# Patient Record
Sex: Male | Born: 1990 | Race: White | Hispanic: No | Marital: Married | Smoking: Current every day smoker
Health system: Southern US, Community
[De-identification: ages and names within clinical notes are randomized; demographics above are authoritative.]

## PROBLEM LIST (undated history)

## (undated) HISTORY — PX: TOE SURGERY: SHX1073

## (undated) HISTORY — PX: WISDOM TOOTH EXTRACTION: SHX21

---

## 2016-06-21 ENCOUNTER — Emergency Department
Admission: EM | Admit: 2016-06-21 | Discharge: 2016-06-21 | Disposition: A | Discharge status: Home / Self Care | Payer: Self-pay | Attending: Emergency Medicine | Admitting: Emergency Medicine

## 2016-06-21 ENCOUNTER — Encounter: Payer: Self-pay | Admitting: Emergency Medicine

## 2016-06-21 ENCOUNTER — Emergency Department: Payer: Self-pay

## 2016-06-21 DIAGNOSIS — F17210 Nicotine dependence, cigarettes, uncomplicated: Secondary | ICD-10-CM | POA: Insufficient documentation

## 2016-06-21 DIAGNOSIS — R52 Pain, unspecified: Secondary | ICD-10-CM | POA: Insufficient documentation

## 2016-06-21 DIAGNOSIS — J029 Acute pharyngitis, unspecified: Secondary | ICD-10-CM | POA: Insufficient documentation

## 2016-06-21 DIAGNOSIS — R509 Fever, unspecified: Secondary | ICD-10-CM

## 2016-06-21 DIAGNOSIS — R05 Cough: Secondary | ICD-10-CM | POA: Insufficient documentation

## 2016-06-21 LAB — COMPREHENSIVE METABOLIC PANEL
ALK PHOS: 69 U/L (ref 38–126)
ALT: 61 U/L (ref 17–63)
AST: 37 U/L (ref 15–41)
Albumin: 4.3 g/dL (ref 3.5–5.0)
Anion gap: 11 (ref 5–15)
BILIRUBIN TOTAL: 1.1 mg/dL (ref 0.3–1.2)
BUN: 10 mg/dL (ref 6–20)
CO2: 23 mmol/L (ref 22–32)
Calcium: 9.6 mg/dL (ref 8.9–10.3)
Chloride: 101 mmol/L (ref 101–111)
Creatinine, Ser: 0.97 mg/dL (ref 0.61–1.24)
GFR calc Af Amer: >60 mL/min (ref >60)
GFR calc non Af Amer: >60 mL/min (ref >60)
GLUCOSE: 120 mg/dL — AB (ref 65–99)
POTASSIUM: 4.4 mmol/L (ref 3.5–5.1)
SODIUM: 135 mmol/L (ref 135–145)
TOTAL PROTEIN: 8.3 g/dL — AB (ref 6.5–8.1)

## 2016-06-21 LAB — URINALYSIS, COMPLETE (UACMP) WITH MICROSCOPIC
BACTERIA UA: NONE SEEN
Bilirubin Urine: NEGATIVE
Glucose, UA: NEGATIVE mg/dL
HGB URINE DIPSTICK: NEGATIVE
Ketones, ur: NEGATIVE mg/dL
Leukocytes, UA: NEGATIVE
NITRITE: NEGATIVE
PROTEIN: NEGATIVE mg/dL
SPECIFIC GRAVITY, URINE: 1.009 (ref 1.005–1.030)
pH: 6 (ref 5.0–8.0)

## 2016-06-21 LAB — INFLUENZA PANEL BY PCR (TYPE A & B)
INFLAPCR: NEGATIVE
INFLBPCR: NEGATIVE

## 2016-06-21 LAB — CBC
HEMATOCRIT: 45.2 % (ref 40.0–52.0)
HEMOGLOBIN: 15.3 g/dL (ref 13.0–18.0)
MCH: 28.7 pg (ref 26.0–34.0)
MCHC: 33.9 g/dL (ref 32.0–36.0)
MCV: 84.6 fL (ref 80.0–100.0)
Platelets: 238 10*3/uL (ref 150–440)
RBC: 5.35 MIL/uL (ref 4.40–5.90)
RDW: 12.2 % (ref 11.5–14.5)
WBC: 27.1 10*3/uL — ABNORMAL HIGH (ref 3.8–10.6)

## 2016-06-21 LAB — LIPASE, BLOOD: Lipase: 22 U/L (ref 11–51)

## 2016-06-21 MED ORDER — SODIUM CHLORIDE 0.9 % IV BOLUS (SEPSIS)
1000.0000 mL | Freq: Once | INTRAVENOUS | Status: AC
  Administered 2016-06-21: 1000 mL via INTRAVENOUS

## 2016-06-21 MED ORDER — ONDANSETRON HCL 4 MG PO TABS: 4.0000 mg | ORAL_TABLET | Freq: Three times a day (TID) | ORAL | 0 refills | Status: DC | PRN

## 2016-06-21 MED ORDER — DEXAMETHASONE SODIUM PHOSPHATE 10 MG/ML IJ SOLN
10.0000 mg | Freq: Once | INTRAMUSCULAR | Status: AC
  Administered 2016-06-21: 10 mg via INTRAMUSCULAR
  Filled 2016-06-21: qty 1

## 2016-06-21 NOTE — ED Triage Notes (Signed)
Patient presents to ED via POV ambulatory to triage with flu like symptoms x 3 days. Patient also c/o sore throat and vomiting.

## 2016-06-21 NOTE — ED Provider Notes (Signed)
[facility] Emergency Department Provider Note   ____________________________________________   I have reviewed the triage vital signs and the nursing notes.   HISTORY  Chief Complaint Fever   History limited by: Not Limited   HPI Colin Moore is a 26 y.o. male who presents to the emergency department today because of concern for flulike symptoms. The patient states that for the past couple of days he has had some sore throat and cough. However this morning around 5 AM he started developing severe body aches. This continued throughout the day. In addition he developed nausea and vomiting this afternoon. He states that many people in his family have been sick recently although does not sound like any of been diagnosed with the flu. The patient has had chills. He is tried over-the-counter pain medication and cold medication.   History reviewed. No pertinent past medical history.  There are no active problems to display for this patient.   History reviewed. No pertinent surgical history.  Prior to Admission medications   Not on File    Allergies Penicillins  No family history on file.  Social History Social History  Substance Use Topics  . Smoking status: Current Every Day Smoker    Packs/day: 0.50    Types: Cigarettes  . Smokeless tobacco: Never Used  . Alcohol use Yes     Comment: Socially    Review of Systems  Constitutional: Positive for chills. Cardiovascular: Negative for chest pain. Respiratory: Positive for cough. Gastrointestinal: Negative for abdominal pain, vomiting and diarrhea. Genitourinary: Negative for dysuria. Musculoskeletal: Positive for body aches. Neurological: Negative for headaches, focal weakness or numbness.  10-point ROS otherwise negative.  ____________________________________________   PHYSICAL EXAM:  VITAL SIGNS: ED Triage Vitals [06/21/16 1856]  Enc Vitals Group     BP 139/89     Pulse Rate (!)  135     Resp 17     Temp 99.6 F (37.6 C)     Temp Source Oral     SpO2 96 %     Weight 210 lb (95.3 kg)     Height 5\' 8"  (1.727 m)     Head Circumference      Peak Flow      Pain Score 6   Constitutional: Alert and oriented. Well appearing and in no distress. Eyes: Conjunctivae are normal. Normal extraocular movements. ENT   Head: Normocephalic and atraumatic.   Nose: No congestion/rhinnorhea.   Mouth/Throat: Mucous membranes are moist. Tonsils enlarged bilaterally with erythema. Uvula midline.    Neck: No stridor. Hematological/Lymphatic/Immunilogical: No cervical lymphadenopathy. Cardiovascular: Tachycardic, regular rhythm.  No murmurs, rubs, or gallops.  Respiratory: Normal respiratory effort without tachypnea nor retractions. Breath sounds are clear and equal bilaterally. No wheezes/rales/rhonchi. Gastrointestinal: Soft and non tender. No rebound. No guarding.  Genitourinary: Deferred Musculoskeletal: Normal range of motion in all extremities. No lower extremity edema. Neurologic:  Normal speech and language. No gross focal neurologic deficits are appreciated.  Skin:  Skin is warm, dry and intact. No rash noted. Psychiatric: Mood and affect are normal. Speech and behavior are normal. Patient exhibits appropriate insight and judgment.  ____________________________________________    LABS (pertinent positives/negatives)  Labs Reviewed  COMPREHENSIVE METABOLIC PANEL - Abnormal; Notable for the following:       Result Value   Glucose, Bld 120 (*)    Total Protein 8.3 (*)    All other components within normal limits  CBC - Abnormal; Notable for the following:    WBC 27.1 (*)  All other components within normal limits  URINALYSIS, COMPLETE (UACMP) WITH MICROSCOPIC - Abnormal; Notable for the following:    Color, Urine YELLOW (*)    APPearance CLEAR (*)    Squamous Epithelial / LPF 0-5 (*)    All other components within normal limits  LIPASE, BLOOD   INFLUENZA PANEL BY PCR (TYPE A & B)     ____________________________________________   EKG  None  ____________________________________________    RADIOLOGY  CXR IMPRESSION:  No active cardiopulmonary disease.   ___________________________________________   PROCEDURES  Procedures  ____________________________________________   INITIAL IMPRESSION / ASSESSMENT AND PLAN / ED COURSE  Pertinent labs & imaging results that were available during my care of the patient were reviewed by me and considered in my medical decision making (see chart for details).  Patient presents to the emergency department today with flulike symptoms. The patient flu negative. Chest x-ray without any pneumonia. Patient did feel better after IV fluids. Patient's tonsils were enlarged however uvula was midline. This point I doubt any deep tissue infection. Will give patient a shot of steroids to help with the pharyngitis. Additionally will give patient prescription for Zofran.  ____________________________________________   FINAL CLINICAL IMPRESSION(S) / ED DIAGNOSES  Final diagnoses:  Fever, unspecified fever cause     Note: This dictation was prepared with Dragon dictation. Any transcriptional errors that result from this process are unintentional     Phineas Semen, MD 06/21/16 2336

## 2016-06-21 NOTE — ED Notes (Signed)

## 2016-06-21 NOTE — Discharge Instructions (Signed)
Please seek medical attention for any high fevers, chest pain, shortness of breath, change in behavior, persistent vomiting, bloody stool or any other new or concerning symptoms.  

## 2016-06-21 NOTE — ED Notes (Signed)
Patient denies pain and is resting comfortably.  

## 2016-06-21 NOTE — ED Notes (Signed)
REturn from Xray.  AAOx3.  Skin warm and dry.

## 2017-05-12 NOTE — Progress Notes (Signed)
05/14/2017 3:52 PM   Colin Gandyharles Brunner 10/11/1990 [id number]  Referring provider: No referring provider defined for this encounter.  Chief Complaint  Patient presents with  . Advice Only    Vasectomy    HPI: Mr. Colin GandyCharles Vanhouten is a [age] Caucasian male who presents today requesting a vasectomy.  Patient has 3 children, two sons and one daughter, who desires no further biological children.  Patient denies any history of chronic prostatitis, epididymitis, orchitis, or other genital pain.  Today, we discussed what the vas deferens is, where it is located, and its function. We reviewed the procedure for vasectomy, it's risks, benefits, alternatives, and likelihood of achieving his goals.   We discussed in detail the procedure, complications, and recovery as well as the need for clearance prior to unprotected intercourse. We discussed that vasectomy does not protect against sexually transmitted diseases. We discussed that this procedure does not result in immediate sterility and that they would need to use other forms of birth control until he has been cleared with a three month negative postvasectomy semen analyses.  I explained that the procedure is considered to be permanent and that attempts at reversal have varying degrees of success. These options include vasectomy reversal, sperm retrieval, and in vitro fertilization; these can be very expensive.   We discussed the chance of postvasectomy pain syndrome which occurs in less than 5% of patients. I explained to the patient that there is no treatment to resolve this chronic pain, and that if it developed I would not be able to help resolve the issue, but that surgery is generally not needed for correction.   I explained there have even been reports of systemic like illness associated with this chronic pain, and that there was no good cure. I explained that vasectomy it is not a 100% reliable form of birth control, and the risk of  pregnancy after vasectomy is approximately 1 in 2000 men who had a negative postvasectomy semen analysis or rare non-motile sperm.  I explained that repeat vasectomy was necessary in less than 1% of vasectomy procedures when employing the type of technique that is performed in the office. I explained that he should refrain from ejaculation for approximately one week following vasectomy. I explained that there are other options for birth control which are permanent and non-permanent; we discussed these.  I explained the rates of surgical complications, such as symptomatic hematoma or infection, are low (1-2%) and vary with the surgeon's experience and criteria used to diagnose the complication.   PMH: No past medical history on file.  Surgical History: Past Surgical History:  Procedure Laterality Date  . TOE SURGERY  10 years ago  . WISDOM TOOTH EXTRACTION      Home Medications:  Allergies as of 05/14/2017      Reactions   Penicillins Hives      Medication List        Accurate as of 05/14/17  3:52 PM. Always use your most recent med list.          diazepam 10 MG tablet Commonly known as:  VALIUM Take one 30 minutes prior to the vasectomy       Allergies:  Allergies  Allergen Reactions  . Penicillins Hives    Family History: No family history on file.  Social History:  reports that he has been smoking cigarettes.  He has been smoking about 0.50 packs per day. he has never used smokeless tobacco. He reports that he drinks alcohol. He  reports that he does not use drugs.  ROS: UROLOGY Frequent Urination?: No Hard to postpone urination?: Yes Burning/pain with urination?: No Get up at night to urinate?: Yes Leakage of urine?: No Urine stream starts and stops?: No Trouble starting stream?: No Do you have to strain to urinate?: No Blood in urine?: No Urinary tract infection?: No Sexually transmitted disease?: No Injury to kidneys or bladder?: No Painful  intercourse?: No Weak stream?: No Erection problems?: No Penile pain?: No  Gastrointestinal Nausea?: No Vomiting?: No Indigestion/heartburn?: No Diarrhea?: No Constipation?: No  Constitutional Fever: No Night sweats?: No Weight loss?: No Fatigue?: No  Skin Skin rash/lesions?: No Itching?: Yes  Eyes Blurred vision?: No Double vision?: No  Ears/Nose/Throat Sore throat?: No Sinus problems?: No  Hematologic/Lymphatic Swollen glands?: No Easy bruising?: No  Cardiovascular Leg swelling?: No Chest pain?: No  Respiratory Cough?: No Shortness of breath?: No  Endocrine Excessive thirst?: No  Musculoskeletal Back pain?: No Joint pain?: No  Neurological Headaches?: No Dizziness?: No  Psychologic Depression?: No Anxiety?: No  Physical Exam: BP 120/80   Pulse 96   Ht 5\' 8"  (1.727 m)   Wt 201 lb 14.4 oz (91.6 kg)   BMI 30.70 kg/m   Constitutional: Well nourished. Alert and oriented, No acute distress. HEENT: [place] AT, moist mucus membranes. Trachea midline, no masses. Cardiovascular: No clubbing, cyanosis, or edema. Respiratory: Normal respiratory effort, no increased work of breathing. GI: Abdomen is soft, non tender, non distended, no abdominal masses. Liver and spleen not palpable.  No hernias appreciated.  Stool sample for occult testing is not indicated.   GU: No CVA tenderness.  No bladder fullness or masses.  Patient with circumcised phallus.   Urethral meatus is patent.  No penile discharge. No penile lesions or rashes. Scrotum without lesions, cysts, rashes and/or edema.  Testicles are located scrotally bilaterally. No masses are appreciated in the testicles. Left and right epididymis are normal.  Vas present bilaterally.   Rectal: Deferred Skin: No rashes, bruises or suspicious lesions. Lymph: No cervical or inguinal adenopathy. Neurologic: Grossly intact, no focal deficits, moving all 4 extremities. Psychiatric: Normal mood and  affect.  Laboratory Data: Lab Results  Component Value Date   WBC 27.1 (H) 06/21/2016   HGB 15.3 06/21/2016   HCT 45.2 06/21/2016   MCV 84.6 06/21/2016   PLT 238 06/21/2016    Lab Results  Component Value Date   CREATININE 0.97 06/21/2016    Lab Results  Component Value Date   AST 37 06/21/2016   Lab Results  Component Value Date   ALT 61 06/21/2016    Urinalysis    Component Value Date/Time   COLORURINE YELLOW (A) 06/21/2016 1859   APPEARANCEUR CLEAR (A) 06/21/2016 1859   LABSPEC 1.009 06/21/2016 1859   PHURINE 6.0 06/21/2016 1859   GLUCOSEU NEGATIVE 06/21/2016 1859   HGBUR NEGATIVE 06/21/2016 1859   BILIRUBINUR NEGATIVE 06/21/2016 1859   KETONESUR NEGATIVE 06/21/2016 1859   PROTEINUR NEGATIVE 06/21/2016 1859   NITRITE NEGATIVE 06/21/2016 1859   LEUKOCYTESUR NEGATIVE 06/21/2016 1859    I have reviewed the labs.   Assessment & Plan:    1. Vasectomy consult:  Patient has read and signed the consent.  He is given the pre-op vasectomy instruction sheet.  He is prescribed Valium 10 mg and instructed to take it 30 minutes prior to his vasectomy appointment.  He is to have a driver.  I reemphasized to the patient that this is to be considered a permanent form of  birth control, that he is to use an alternative form of birth control until we receive the 3 months specimen and it is cleared of sperm and that this will not prevent STI's.  His questions are answered to his satisfaction and he understands the risks and is willing to proceed with the vasectomy.  He will schedule his vasectomy.    I spent 30 minutes in a face-to-face conversation concerning the vasectomy procedure and pre-and post op expectations.  Greater than 50% was spent in counseling & coordination of care with the patient.   Return for vasectomy.  These notes generated with voice recognition software. I apologize for typographical errors.  Michiel Cowboy, PA-C  [facility] [address] [place], [place] [address] [phone]

## 2017-05-14 ENCOUNTER — Other Ambulatory Visit: Payer: Self-pay

## 2017-05-14 ENCOUNTER — Encounter: Payer: Self-pay | Admitting: Urology

## 2017-05-14 ENCOUNTER — Ambulatory Visit (INDEPENDENT_AMBULATORY_CARE_PROVIDER_SITE_OTHER): Payer: Self-pay | Admitting: Urology

## 2017-05-14 VITALS — BP 120/80 | HR 96 | Ht 68.0 in | Wt 201.9 lb

## 2017-05-14 DIAGNOSIS — Z3009 Encounter for other general counseling and advice on contraception: Secondary | ICD-10-CM

## 2017-05-14 MED ORDER — DIAZEPAM 10 MG PO TABS: ORAL_TABLET | ORAL | 0 refills | Status: DC

## 2017-05-24 ENCOUNTER — Encounter: Payer: Self-pay | Admitting: Urology

## 2017-05-24 ENCOUNTER — Ambulatory Visit (INDEPENDENT_AMBULATORY_CARE_PROVIDER_SITE_OTHER): Payer: Self-pay | Admitting: Urology

## 2017-05-24 VITALS — BP 147/80 | HR 99 | Ht 68.0 in | Wt 204.7 lb

## 2017-05-24 DIAGNOSIS — Z3009 Encounter for other general counseling and advice on contraception: Secondary | ICD-10-CM

## 2017-05-24 MED ORDER — OXYCODONE-ACETAMINOPHEN 5-325 MG PO TABS: 1.0000 | ORAL_TABLET | ORAL | 0 refills | Status: DC | PRN

## 2017-05-24 NOTE — Progress Notes (Signed)
Bilateral Vasectomy Procedure  Pre-Procedure: - Patient's scrotum was prepped and draped for vasectomy. - The vas was palpated through the scrotal skin on the left. - 1% Xylocaine was injected into the skin and surrounding tissue for placement  - In a similar manner, the vas on the right was identified, anesthetized, and stabilized.  Procedure: - A scalpel was used to make a 1 cm incision in his left hemiscrotum - The left vas was isolated and brought up through the incision exposing that structure. - Bleeding points were cauterized as they occurred. - The vas was free from the surrounding structures and brought into view. - A segment was positioned for placement with a hemostat. - A second hemostat was placed and a small segment between the two hemostats and was removed for inspection. - Each end of the transected vas lumen was fulgurated/obliterated using needlepoint electrocautery. It was then tied off with a #2-0 silk suture -The same procedure was performed on the right. - A suture of #3-0 chromic catgut was used to close each lateral scrotal skin incision  Post-Procedure: - Patient was instructed in care of the operative area - A specimen is to be delivered in 12 and 16 weeks   -Another form of contraception is to be used until he is cleared by the office.   

## 2017-05-28 ENCOUNTER — Encounter: Payer: Self-pay | Admitting: Emergency Medicine

## 2017-05-28 ENCOUNTER — Emergency Department
Admission: EM | Admit: 2017-05-28 | Discharge: 2017-05-28 | Disposition: A | Discharge status: Home / Self Care | Payer: Self-pay | Attending: Emergency Medicine | Admitting: Emergency Medicine

## 2017-05-28 ENCOUNTER — Emergency Department: Payer: Self-pay

## 2017-05-28 ENCOUNTER — Other Ambulatory Visit: Payer: Self-pay

## 2017-05-28 DIAGNOSIS — F17210 Nicotine dependence, cigarettes, uncomplicated: Secondary | ICD-10-CM | POA: Insufficient documentation

## 2017-05-28 DIAGNOSIS — N5089 Other specified disorders of the male genital organs: Secondary | ICD-10-CM

## 2017-05-28 DIAGNOSIS — G8918 Other acute postprocedural pain: Secondary | ICD-10-CM

## 2017-05-28 DIAGNOSIS — N99840 Postprocedural hematoma of a genitourinary system organ or structure following a genitourinary system procedure: Secondary | ICD-10-CM

## 2017-05-28 DIAGNOSIS — Z79899 Other long term (current) drug therapy: Secondary | ICD-10-CM | POA: Insufficient documentation

## 2017-05-28 DIAGNOSIS — N50819 Testicular pain, unspecified: Secondary | ICD-10-CM

## 2017-05-28 LAB — CBC WITH DIFFERENTIAL/PLATELET
BASOS ABS: 0.1 10*3/uL (ref 0–0.1)
BASOS PCT: 1 %
EOS PCT: 2 %
Eosinophils Absolute: 0.3 10*3/uL (ref 0–0.7)
HCT: 42.8 % (ref 40.0–52.0)
Hemoglobin: 14.5 g/dL (ref 13.0–18.0)
LYMPHS PCT: 13 %
Lymphs Abs: 2.4 10*3/uL (ref 1.0–3.6)
MCH: 28.6 pg (ref 26.0–34.0)
MCHC: 33.9 g/dL (ref 32.0–36.0)
MCV: 84.3 fL (ref 80.0–100.0)
Monocytes Absolute: 1.5 10*3/uL — ABNORMAL HIGH (ref 0.2–1.0)
Monocytes Relative: 8 %
Neutro Abs: 14.2 10*3/uL — ABNORMAL HIGH (ref 1.4–6.5)
Neutrophils Relative %: 76 %
PLATELETS: 226 10*3/uL (ref 150–440)
RBC: 5.08 MIL/uL (ref 4.40–5.90)
RDW: 12.6 % (ref 11.5–14.5)
WBC: 18.5 10*3/uL — AB (ref 3.8–10.6)

## 2017-05-28 LAB — URINALYSIS, COMPLETE (UACMP) WITH MICROSCOPIC
BILIRUBIN URINE: NEGATIVE
Bacteria, UA: NONE SEEN
GLUCOSE, UA: NEGATIVE mg/dL
HGB URINE DIPSTICK: NEGATIVE
KETONES UR: NEGATIVE mg/dL
LEUKOCYTES UA: NEGATIVE
NITRITE: NEGATIVE
PH: 5 (ref 5.0–8.0)
Protein, ur: NEGATIVE mg/dL
SPECIFIC GRAVITY, URINE: 1.027 (ref 1.005–1.030)
Squamous Epithelial / LPF: NONE SEEN

## 2017-05-28 LAB — CHLAMYDIA/NGC RT PCR (ARMC ONLY)
Chlamydia Tr: NOT DETECTED
N gonorrhoeae: NOT DETECTED

## 2017-05-28 LAB — BASIC METABOLIC PANEL
ANION GAP: 10 (ref 5–15)
BUN: 13 mg/dL (ref 6–20)
CALCIUM: 9 mg/dL (ref 8.9–10.3)
CO2: 24 mmol/L (ref 22–32)
Chloride: 103 mmol/L (ref 101–111)
Creatinine, Ser: 0.75 mg/dL (ref 0.61–1.24)
GLUCOSE: 93 mg/dL (ref 65–99)
POTASSIUM: 4.1 mmol/L (ref 3.5–5.1)
SODIUM: 137 mmol/L (ref 135–145)

## 2017-05-28 MED ORDER — KETOROLAC TROMETHAMINE 30 MG/ML IJ SOLN
15.0000 mg | Freq: Once | INTRAMUSCULAR | Status: AC
  Administered 2017-05-28: 15 mg via INTRAVENOUS
  Filled 2017-05-28: qty 1

## 2017-05-28 MED ORDER — SULFAMETHOXAZOLE-TRIMETHOPRIM 800-160 MG PO TABS: 1.0000 | ORAL_TABLET | Freq: Two times a day (BID) | ORAL | 0 refills | Status: DC

## 2017-05-28 MED ORDER — SULFAMETHOXAZOLE-TRIMETHOPRIM 800-160 MG PO TABS
1.0000 | ORAL_TABLET | Freq: Once | ORAL | Status: AC
  Administered 2017-05-28: 1 via ORAL
  Filled 2017-05-28: qty 1

## 2017-05-28 MED ORDER — IBUPROFEN 600 MG PO TABS: 600.0000 mg | ORAL_TABLET | Freq: Four times a day (QID) | ORAL | 0 refills | Status: AC | PRN

## 2017-05-28 NOTE — ED Provider Notes (Addendum)
[facility] Emergency Department Provider Note  ____________________________________________  Time seen: Approximately 1:10 PM  I have reviewed the triage vital signs and the nursing notes.   HISTORY  Chief Complaint Groin Swelling and Post-op Problem   HPI Colin Moore is a 27 y.o. male POD 4 from vasectomy who presents for evaluation of L testicular pain and swelling. Patient reports that pain has been consistent since the procedure, dull, 7/10, worsen when he stands up. For the last 2 days he has noticed progressively worsening swelling and bruising in the L testicle. No fever, chills, dysuria, hematuria, nausea, or vomiting,. The swelling is much worse this am which prompted his visit to the ED.   History reviewed. No pertinent past medical history.  There are no active problems to display for this patient.   Past Surgical History:  Procedure Laterality Date  . TOE SURGERY  10 years ago  . WISDOM TOOTH EXTRACTION      Prior to Admission medications   Medication Sig Start Date End Date Taking? Authorizing Provider  diazepam (VALIUM) 10 MG tablet Take one 30 minutes prior to the vasectomy 05/14/17   Michiel Cowboy A, PA-C  ibuprofen (ADVIL,MOTRIN) 600 MG tablet Take 1 tablet (600 mg total) by mouth every 6 (six) hours as needed. 05/28/17   Nita Sickle, MD  oxyCODONE-acetaminophen (ROXICET) 5-325 MG tablet Take 1 tablet by mouth every 4 (four) hours as needed for severe pain. 05/24/17   Hildred Laser, MD  sulfamethoxazole-trimethoprim (BACTRIM DS,SEPTRA DS) 800-160 MG tablet Take 1 tablet by mouth 2 (two) times daily for 7 days. 05/28/17 06/04/17  Nita Sickle, MD    Allergies Penicillins  No family history on file.  Social History Social History   Tobacco Use  . Smoking status: Current Every Day Smoker    Packs/day: 0.50    Types: Cigarettes  . Smokeless tobacco: Never Used  Substance Use Topics  . Alcohol use: Yes    Comment: Socially  . Drug use: No    Review of Systems  Constitutional: Negative for fever. Eyes: Negative for visual changes. ENT: Negative for sore throat. Neck: No neck pain  Cardiovascular: Negative for chest pain. Respiratory: Negative for shortness of breath. Gastrointestinal: Negative for abdominal pain, vomiting or diarrhea. Genitourinary: Negative for dysuria. + L testicular swelling and bruising Musculoskeletal: Negative for back pain. Skin: Negative for rash. Neurological: Negative for headaches, weakness or numbness. Psych: No SI or HI  ____________________________________________   PHYSICAL EXAM:  VITAL SIGNS: ED Triage Vitals  Enc Vitals Group     BP 05/28/17 1113 (!) 130/93     Pulse Rate 05/28/17 1113 (!) 117     Resp 05/28/17 1113 18     Temp 05/28/17 1113 98.4 F (36.9 C)     Temp Source 05/28/17 1113 Oral     SpO2 05/28/17 1113 96 %     Weight 05/28/17 1109 204 lb (92.5 kg)     Height 05/28/17 1109 5\' 8"  (1.727 m)     Head Circumference --      Peak Flow --      Pain Score 05/28/17 1108 9     Pain Loc --      Pain Edu? --      Excl. in GC? --     Constitutional: Alert and oriented. Well appearing and in no apparent distress. HEENT:      Head: Normocephalic and atraumatic.         Eyes: Conjunctivae are  normal. Sclera is non-icteric.       Mouth/Throat: Mucous membranes are moist.       Neck: Supple with no signs of meningismus. Cardiovascular: Regular rate and rhythm. No murmurs, gallops, or rubs. 2+ symmetrical distal pulses are present in all extremities. No JVD. Respiratory: Normal respiratory effort. Lungs are clear to auscultation bilaterally. No wheezes, crackles, or rhonchi.  Gastrointestinal: Soft, non tender, and non distended with positive bowel sounds. No rebound or guarding. Genitourinary: No CVA tenderness. L scrotum is indurated, enlarged, with ecchymosis, and ttp. R testicle is normal Musculoskeletal: Nontender with normal range  of motion in all extremities. No edema, cyanosis, or erythema of extremities. Neurologic: Normal speech and language. Face is symmetric. Moving all extremities. No gross focal neurologic deficits are appreciated. Skin: Skin is warm, dry and intact. No rash noted. Psychiatric: Mood and affect are normal. Speech and behavior are normal.  ____________________________________________   LABS (all labs ordered are listed, but only abnormal results are displayed)  Labs Reviewed  CBC WITH DIFFERENTIAL/PLATELET - Abnormal; Notable for the following components:      Result Value   WBC 18.5 (*)    Neutro Abs 14.2 (*)    Monocytes Absolute 1.5 (*)    All other components within normal limits  URINALYSIS, COMPLETE (UACMP) WITH MICROSCOPIC - Abnormal; Notable for the following components:   Color, Urine YELLOW (*)    APPearance HAZY (*)    All other components within normal limits  CHLAMYDIA/NGC RT PCR ([facility] ONLY)  BASIC METABOLIC PANEL   ____________________________________________  EKG  none  ____________________________________________  RADIOLOGY  US scrotum:  Small complex left hydrocele may be related to hemorrhage or infection. This is associated with hyperemia in the left testicle and epididymis. Imaging features concerning for left epididymo orchitis. ____________________________________________   PROCEDURES  Procedure(s) performed: None Procedures Critical Care performed:  None ____________________________________________   INITIAL IMPRESSION / ASSESSMENT AND PLAN / ED COURSE  27 y.o. male POD 4 from vasectomy who presents for evaluation of L testicular pain and swelling. L scrotum is indurated, large ecchymosis and tender to palpation. Ultrasound concerning for a left complex hydrocele which could be due to hemorrhage from his recent procedure but also possible infection. There is also hyperemia in the left testicle and epididymis concerning for possible  epididymoorchitis or just irritation from exposure to blood. I discussed with Dr. Mena GoesEskridge, Urologist on call who says this is pretty normal after a vasectomy. He recommended bactrim, NSAIDs, scrotal support, and close f/u in the office. Will check basic labs, give IV toradol, and start bactrim here.      _________________________ 3:01 PM on 05/28/2017 -----------------------------------------  Labs showing leukocytosis most likely reactive from recent procedure however infection is also possible. Patient has had no changes in pain since the surgery, no fever, no nausea or vomiting. He has received a dose of bactrim and will be dc home on ibuprofen, bactrim, ice, elevation and f/u with his doctor tomorrow. Discussed return precautions for any signs of infection, testicular torsion and recommended return to the ER if those develop. Patient is comfortable with this pain.   As part of my medical decision making, I reviewed the following data within the electronic MEDICAL RECORD NUMBER Nursing notes reviewed and incorporated, Labs reviewed , Radiograph reviewed , A consult was requested and obtained from this/these consultant(s) Urology, Notes from prior ED visits and [place] Controlled Substance Database    Pertinent labs & imaging results that were available during my care of  the patient were reviewed by me and considered in my medical decision making (see chart for details).    ____________________________________________   FINAL CLINICAL IMPRESSION(S) / ED DIAGNOSES  Final diagnoses:  Evaluation of postoperative testicular pain within 30 days of vasectomy  Testicular swelling, left  Postoperative hematoma involving genitourinary system following genitourinary procedure      NEW MEDICATIONS STARTED DURING THIS VISIT:  ED Discharge Orders        Ordered    ibuprofen (ADVIL,MOTRIN) 600 MG tablet  Every 6 hours PRN     05/28/17 1459    sulfamethoxazole-trimethoprim (BACTRIM DS,SEPTRA DS)  800-160 MG tablet  2 times daily     05/28/17 1459       Note:  This document was prepared using Dragon voice recognition software and may include unintentional dictation errors.    Don Perking, [place], MD 05/28/17 1503      Nita Sickle, MD 05/28/17 2040

## 2017-05-28 NOTE — ED Notes (Signed)
Nurse explained to pt that she needed a urine sample when he could try to go. Nurse gave pt specimen containers and water per his request.

## 2017-05-28 NOTE — ED Notes (Signed)
Dr. Don PerkingVeronese is speaking with pt at this time.

## 2017-05-28 NOTE — ED Triage Notes (Signed)
Left groin swelling

## 2017-05-28 NOTE — Discharge Instructions (Signed)
take the antibiotics twice a day as prescribed. Take ibuprofen 600 mg every 6 hours. Take oxycodone as needed for breakthrough pain. Elevate your testicle by using tight underwear. Apply ice several times a day. Call your urologist tomorrow for close follow-up. Return to the emergency room if you have severe or worsening pain, worsening swelling or bruising, fever, nausea or vomiting.

## 2017-05-28 NOTE — ED Notes (Signed)
Pt denying any fevers or chills. Pt denying any issues with urination at this time. Pt stating he has pain since the procedure on Friday. Pt stating that his swelling increased today on the left testicle. Pt in NAD at this time. Pt has ice pack at this time. Pt's father stating that pt had more trauma during the procedure to the left side. Dr. Don PerkingVeronese at bedside for examination.

## 2017-05-28 NOTE — ED Notes (Signed)
Pt in NAD at this time and family remains at bedside.

## 2017-05-28 NOTE — ED Triage Notes (Signed)
First nurse.  Had vasectomy on Friday and now has swelling to groin.

## 2017-05-29 ENCOUNTER — Ambulatory Visit (INDEPENDENT_AMBULATORY_CARE_PROVIDER_SITE_OTHER): Payer: Self-pay | Admitting: Urology

## 2017-05-29 ENCOUNTER — Encounter: Payer: Self-pay | Admitting: Urology

## 2017-05-29 VITALS — BP 122/77 | HR 93 | Ht 68.0 in | Wt 203.5 lb

## 2017-05-29 DIAGNOSIS — N451 Epididymitis: Secondary | ICD-10-CM

## 2017-05-29 NOTE — Progress Notes (Signed)
05/29/2017 4:07 PM   Landis Gandyharles Bamford 01/13/1991 [id number]  Referring provider: No referring provider defined for this encounter.  Chief Complaint  Patient presents with  . Follow-up    post vas    HPI: Patient underwent a vasectomy on 05/24/2017 with Dr. Sherryl BartersBudzyn and presents today requesting an urgent appointment for scrotal pain.    He states he has had tenderness in his left scrotum since the vasectomy.  Over the weekend, he started to notice more pain and swelling.  He states the sutures fell out and he started to have bleeding.    He was seen in the ED yesterday and a scrotal ultrasound was performed which noted small complex left hydrocele may be related to hemorrhage or infection. This is associated with hyperemia in the left testicle and epididymis. Imaging features concerning for left epididymo Orchitis.  He was discharged on Bactrim, ibuprofen and percocet.  He has had three doses of the Bactrim prior to his visit with us today.    He denies any fevers, chills, nausea or vomiting.  He has noticed drainage from the left scrotal incision.  He states that he is basically the same as he was yesterday.       PMH: History reviewed. No pertinent past medical history.  Surgical History: Past Surgical History:  Procedure Laterality Date  . TOE SURGERY  10 years ago  . WISDOM TOOTH EXTRACTION      Home Medications:  Allergies as of 05/29/2017      Reactions   Penicillins Hives      Medication List        Accurate as of 05/29/17  4:07 PM. Always use your most recent med list.          ibuprofen 600 MG tablet Commonly known as:  ADVIL,MOTRIN Take 1 tablet (600 mg total) by mouth every 6 (six) hours as needed.   oxyCODONE-acetaminophen 5-325 MG tablet Commonly known as:  ROXICET Take 1 tablet by mouth every 4 (four) hours as needed for severe pain.   sulfamethoxazole-trimethoprim 800-160 MG tablet Commonly known as:  BACTRIM DS,SEPTRA DS Take 1 tablet by  mouth 2 (two) times daily for 7 days.       Allergies:  Allergies  Allergen Reactions  . Penicillins Hives    Family History: History reviewed. No pertinent family history.  Social History:  reports that he has been smoking cigarettes.  He has been smoking about 0.50 packs per day. he has never used smokeless tobacco. He reports that he drinks alcohol. He reports that he does not use drugs.  ROS: UROLOGY Frequent Urination?: No Hard to postpone urination?: No Burning/pain with urination?: No Get up at night to urinate?: No Leakage of urine?: No Urine stream starts and stops?: No Trouble starting stream?: No Do you have to strain to urinate?: No Blood in urine?: No Urinary tract infection?: No Sexually transmitted disease?: No Injury to kidneys or bladder?: No Painful intercourse?: No Weak stream?: No Erection problems?: No Penile pain?: No  Gastrointestinal Nausea?: No Vomiting?: No Indigestion/heartburn?: No Diarrhea?: No Constipation?: Yes  Constitutional Fever: No Night sweats?: No Weight loss?: No Fatigue?: No  Skin Skin rash/lesions?: No Itching?: No  Eyes Blurred vision?: No Double vision?: No  Ears/Nose/Throat Sore throat?: No Sinus problems?: No  Hematologic/Lymphatic Swollen glands?: No Easy bruising?: No  Cardiovascular Leg swelling?: No Chest pain?: No  Respiratory Cough?: No Shortness of breath?: No  Endocrine Excessive thirst?: No  Musculoskeletal Back pain?: No Joint  pain?: No  Neurological Headaches?: No Dizziness?: No  Psychologic Depression?: No Anxiety?: No  Physical Exam: BP 122/77 (BP Location: Right Arm, Patient Position: Sitting, Cuff Size: Normal)   Pulse 93   Ht 5\' 8"  (1.727 m)   Wt 203 lb 8 oz (92.3 kg)   BMI 30.94 kg/m   Constitutional: Well nourished. Alert and oriented, No acute distress. HEENT: [place] AT, moist mucus membranes. Trachea midline, no masses. Cardiovascular: No clubbing, cyanosis,  or edema. Respiratory: Normal respiratory effort, no increased work of breathing. GI: Abdomen is soft, non tender, non distended, no abdominal masses. Liver and spleen not palpable.  No hernias appreciated.  Stool sample for occult testing is not indicated.   GU: No CVA tenderness.  No bladder fullness or masses.  Patient with circumcised phallus.   Urethral meatus is patent.  No penile discharge. No penile lesions or rashes. Right scrotum without lesions, cysts, rashes and/or edema.  Left scrotum is indurated with ecchymosis, no fluctuance or crepitus is noted.  Testicles are located scrotally bilaterally. No masses are appreciated in the testicles. Right epididymis are normal.  Left epididymis is tender and indurated.  There is a serosanguinous drainage followed by blood draining from the incision site.  This is collected for culture.   Rectal: Not performed. Skin: No rashes, bruises or suspicious lesions. Lymph: No cervical or inguinal adenopathy. Neurologic: Grossly intact, no focal deficits, moving all 4 extremities. Psychiatric: Normal mood and affect.  Laboratory Data: Lab Results  Component Value Date   WBC 18.5 (H) 05/28/2017   HGB 14.5 05/28/2017   HCT 42.8 05/28/2017   MCV 84.3 05/28/2017   PLT 226 05/28/2017    Lab Results  Component Value Date   CREATININE 0.75 05/28/2017    Lab Results  Component Value Date   AST 37 06/21/2016   Lab Results  Component Value Date   ALT 61 06/21/2016    Urinalysis    Component Value Date/Time   COLORURINE YELLOW (A) 05/28/2017 1304   APPEARANCEUR HAZY (A) 05/28/2017 1304   LABSPEC 1.027 05/28/2017 1304   PHURINE 5.0 05/28/2017 1304   GLUCOSEU NEGATIVE 05/28/2017 1304   HGBUR NEGATIVE 05/28/2017 1304   BILIRUBINUR NEGATIVE 05/28/2017 1304   KETONESUR NEGATIVE 05/28/2017 1304   PROTEINUR NEGATIVE 05/28/2017 1304   NITRITE NEGATIVE 05/28/2017 1304   LEUKOCYTESUR NEGATIVE 05/28/2017 1304    I have reviewed the  labs.   Pertinent Imaging: CLINICAL DATA:  Testicular pain with vasectomy 4 days ago  EXAM: SCROTAL ULTRASOUND  DOPPLER ULTRASOUND OF THE TESTICLES  TECHNIQUE: Complete ultrasound examination of the testicles, epididymis, and other scrotal structures was performed. Color and spectral Doppler ultrasound were also utilized to evaluate blood flow to the testicles.  COMPARISON:  No comparison studies available.  FINDINGS: Right testicle  Measurements: 4.7 x 2.1 x 2.9 cm. No mass or microlithiasis visualized.  Left testicle  Measurements: 4.4 x 2.6 x 2.8 cm. No mass or microlithiasis visualized.  Right epididymis:  Normal in size and appearance.  Left epididymis:  Normal in size and appearance.  Hydrocele: Simple appearing physiologic volume fluid in the right hemiscrotum. Complex mild left hydrocele demonstrates internal reticulation.  Varicocele:  None visualized.  Pulsed Doppler interrogation of both testes demonstrates normal low resistance arterial and venous waveforms bilaterally. Color Doppler evaluation demonstrates mild hyperemia in the left epididymal tissues and testicle.  IMPRESSION: 1. Small complex left hydrocele may be related to hemorrhage or infection. This is associated with hyperemia in the left testicle  and epididymis. Imaging features concerning for left epididymo orchitis.   Electronically Signed   By: Kennith Center M.D.   On: 05/28/2017 12:53 I have independently reviewed the films.    Assessment & Plan:    1. Left epididymitis  - wound culture are sent  - patient to continue the Bactrim and Percocet - hold ibuprofen for now  - reviewed red flag signs  - RTC tomorrow for exam and follow up    Return for tomorrow.  These notes generated with voice recognition software. I apologize for typographical errors.  Michiel Cowboy, PA-C  [facility] [address] [place], [place]  [address] [phone]

## 2017-05-30 ENCOUNTER — Encounter: Payer: Self-pay | Admitting: Urology

## 2017-05-30 ENCOUNTER — Ambulatory Visit (INDEPENDENT_AMBULATORY_CARE_PROVIDER_SITE_OTHER): Payer: Self-pay | Admitting: Urology

## 2017-05-30 VITALS — BP 144/87 | HR 114 | Ht 68.0 in | Wt 210.0 lb

## 2017-05-30 DIAGNOSIS — N492 Inflammatory disorders of scrotum: Secondary | ICD-10-CM

## 2017-05-30 DIAGNOSIS — N451 Epididymitis: Secondary | ICD-10-CM

## 2017-05-30 NOTE — Progress Notes (Signed)
05/30/2017 9:28 AM   Landis Gandy 03-17-1991 [id number]  Referring provider: No referring provider defined for this encounter.  No chief complaint on file.   HPI: Patient underwent a vasectomy on 05/24/2017 with Dr. Sherryl Barters and presents today requesting an urgent appointment for scrotal pain.    He states he has had tenderness in his left scrotum since the vasectomy.  Over the weekend, he started to notice more pain and swelling.  He states the sutures fell out and he started to have bleeding.    He was seen in the ED yesterday and a scrotal ultrasound was performed which noted small complex left hydrocele may be related to hemorrhage or infection. This is associated with hyperemia in the left testicle and epididymis. Imaging features concerning for left epididymo Orchitis.  He was discharged on Bactrim, ibuprofen and percocet.   He has not noticed any worsening during the night.  He denies any fevers, chills, nausea or vomiting.  He has noticed drainage from the left scrotal incision.  He states that he is basically the same as he was yesterday.       PMH: No past medical history on file.  Surgical History: Past Surgical History:  Procedure Laterality Date  . TOE SURGERY  10 years ago  . WISDOM TOOTH EXTRACTION      Home Medications:  Allergies as of 05/30/2017      Reactions   Penicillins Hives      Medication List        Accurate as of 05/30/17  9:28 AM. Always use your most recent med list.          ibuprofen 600 MG tablet Commonly known as:  ADVIL,MOTRIN Take 1 tablet (600 mg total) by mouth every 6 (six) hours as needed.   oxyCODONE-acetaminophen 5-325 MG tablet Commonly known as:  ROXICET Take 1 tablet by mouth every 4 (four) hours as needed for severe pain.   sulfamethoxazole-trimethoprim 800-160 MG tablet Commonly known as:  BACTRIM DS,SEPTRA DS Take 1 tablet by mouth 2 (two) times daily for 7 days.       Allergies:  Allergies  Allergen  Reactions  . Penicillins Hives    Family History: No family history on file.  Social History:  reports that he has been smoking cigarettes.  He has been smoking about 0.50 packs per day. he has never used smokeless tobacco. He reports that he drinks alcohol. He reports that he does not use drugs.  ROS: UROLOGY Frequent Urination?: No Hard to postpone urination?: No Burning/pain with urination?: No Get up at night to urinate?: No Leakage of urine?: No Urine stream starts and stops?: No Trouble starting stream?: No Do you have to strain to urinate?: No Blood in urine?: No Urinary tract infection?: No Sexually transmitted disease?: No Injury to kidneys or bladder?: No Painful intercourse?: No Weak stream?: No Erection problems?: No Penile pain?: No  Gastrointestinal Nausea?: No Vomiting?: No Indigestion/heartburn?: No Diarrhea?: No Constipation?: No  Constitutional Fever: No Night sweats?: No Weight loss?: No Fatigue?: No  Skin Skin rash/lesions?: No Itching?: No  Eyes Blurred vision?: No Double vision?: No  Ears/Nose/Throat Sore throat?: No Sinus problems?: No  Hematologic/Lymphatic Swollen glands?: No Easy bruising?: No  Cardiovascular Leg swelling?: No Chest pain?: No  Respiratory Cough?: No Shortness of breath?: No  Endocrine Excessive thirst?: No  Musculoskeletal Back pain?: No Joint pain?: No  Neurological Headaches?: No Dizziness?: No  Psychologic Depression?: No Anxiety?: No  Physical Exam: BP Marland Kitchen)  144/87   Pulse (!) 114   Ht 5\' 8"  (1.727 m)   Wt 210 lb (95.3 kg)   BMI 31.93 kg/m   Constitutional: Well nourished. Alert and oriented, No acute distress. HEENT: [place] AT, moist mucus membranes. Trachea midline, no masses. Cardiovascular: No clubbing, cyanosis, or edema. Respiratory: Normal respiratory effort, no increased work of breathing. GI: Abdomen is soft, non tender, non distended, no abdominal masses. Liver and spleen  not palpable.  No hernias appreciated.  Stool sample for occult testing is not indicated.   GU: No CVA tenderness.  No bladder fullness or masses.  Patient with circumcised phallus.   Urethral meatus is patent.  No penile discharge. No penile lesions or rashes. Right scrotum without lesions, cysts, rashes and/or edema.  Left scrotum is indurated with ecchymosis, no fluctuance or crepitus is noted.  More drainage is seen from the incision.  Testicles are located scrotally bilaterally. No masses are appreciated in the testicles. Right epididymis are normal.  Left epididymis is tender and indurated.  There is a serosanguinous drainage followed by blood draining from the incision site.  This was collected for culture yesterday.   Rectal: Not performed. Skin: No rashes, bruises or suspicious lesions. Lymph: No cervical or inguinal adenopathy. Neurologic: Grossly intact, no focal deficits, moving all 4 extremities. Psychiatric: Normal mood and affect.  Laboratory Data: Lab Results  Component Value Date   WBC 18.5 (H) 05/28/2017   HGB 14.5 05/28/2017   HCT 42.8 05/28/2017   MCV 84.3 05/28/2017   PLT 226 05/28/2017    Lab Results  Component Value Date   CREATININE 0.75 05/28/2017    Lab Results  Component Value Date   AST 37 06/21/2016   Lab Results  Component Value Date   ALT 61 06/21/2016    Urinalysis    Component Value Date/Time   COLORURINE YELLOW (A) 05/28/2017 1304   APPEARANCEUR HAZY (A) 05/28/2017 1304   LABSPEC 1.027 05/28/2017 1304   PHURINE 5.0 05/28/2017 1304   GLUCOSEU NEGATIVE 05/28/2017 1304   HGBUR NEGATIVE 05/28/2017 1304   BILIRUBINUR NEGATIVE 05/28/2017 1304   KETONESUR NEGATIVE 05/28/2017 1304   PROTEINUR NEGATIVE 05/28/2017 1304   NITRITE NEGATIVE 05/28/2017 1304   LEUKOCYTESUR NEGATIVE 05/28/2017 1304    I have reviewed the labs.   Pertinent Imaging: CLINICAL DATA:  Testicular pain with vasectomy 4 days ago  EXAM: SCROTAL ULTRASOUND  DOPPLER  ULTRASOUND OF THE TESTICLES  TECHNIQUE: Complete ultrasound examination of the testicles, epididymis, and other scrotal structures was performed. Color and spectral Doppler ultrasound were also utilized to evaluate blood flow to the testicles.  COMPARISON:  No comparison studies available.  FINDINGS: Right testicle  Measurements: 4.7 x 2.1 x 2.9 cm. No mass or microlithiasis visualized.  Left testicle  Measurements: 4.4 x 2.6 x 2.8 cm. No mass or microlithiasis visualized.  Right epididymis:  Normal in size and appearance.  Left epididymis:  Normal in size and appearance.  Hydrocele: Simple appearing physiologic volume fluid in the right hemiscrotum. Complex mild left hydrocele demonstrates internal reticulation.  Varicocele:  None visualized.  Pulsed Doppler interrogation of both testes demonstrates normal low resistance arterial and venous waveforms bilaterally. Color Doppler evaluation demonstrates mild hyperemia in the left epididymal tissues and testicle.  IMPRESSION: 1. Small complex left hydrocele may be related to hemorrhage or infection. This is associated with hyperemia in the left testicle and epididymis. Imaging features concerning for left epididymo orchitis.   Electronically Signed   By: Jamison OkaEric  Mansell M.D.  On: 05/28/2017 12:53 I have independently reviewed the films.    Procedure Verbal informed consent was obtained.  The patient's identity was confirmed. The patient's left hemiscrotum was prepped and draped in usual sterile fashion. The overlying skin was anesthetized with 1% lidocaine without epinephrine. A stab  incision was then made with a #11 blade in the area of the left scrotal incision.  A hemostat was used to explore the cavity.  A scant amount of drainage is recovered.  The wound was then packed with iodoform dressing.     Assessment & Plan:    1. Left epididymitis  - wound culture are pending  - patient to continue  the Bactrim and Percocet - hold ibuprofen for now  - reviewed red flag signs  - RTC tomorrow for exam and follow up  2. Left scrotal abscess  - I & D performed today  - patient would like the dressing to be repacked by [place] - he will return tomorrow    Return for tomorrow for wound repacking.  These notes generated with voice recognition software. I apologize for typographical errors.  Michiel Cowboy, PA-C  [facility] [address] [place], [place] [address] [phone]

## 2017-05-31 ENCOUNTER — Ambulatory Visit (INDEPENDENT_AMBULATORY_CARE_PROVIDER_SITE_OTHER): Payer: Self-pay | Admitting: Urology

## 2017-05-31 ENCOUNTER — Encounter: Payer: Self-pay | Admitting: Urology

## 2017-05-31 VITALS — BP 131/92 | HR 103 | Ht 68.0 in | Wt 200.0 lb

## 2017-05-31 DIAGNOSIS — N492 Inflammatory disorders of scrotum: Secondary | ICD-10-CM

## 2017-05-31 MED ORDER — OXYCODONE-ACETAMINOPHEN 5-325 MG PO TABS: 1.0000 | ORAL_TABLET | ORAL | 0 refills | Status: DC | PRN

## 2017-05-31 MED ORDER — SULFAMETHOXAZOLE-TRIMETHOPRIM 800-160 MG PO TABS: 1.0000 | ORAL_TABLET | Freq: Two times a day (BID) | ORAL | 0 refills | Status: DC

## 2017-05-31 NOTE — Progress Notes (Signed)
05/31/2017 9:52 AM   Colin Moore 06/20/1990 [id number]  Referring provider: No referring provider defined for this encounter.  Chief Complaint  Patient presents with  . Follow-up    HPI: The patient is a 27 year old male who underwent a vasectomy on 05/24/2017 presents today for follow-up after having an I&D of a small scrotal abscess on the left side yesterday.  He is currently on Bactrim and Percocet.  Wound cultures are pending.  He presents today for a wound check.  Of note incision was made from an area that was draining his left hemiscrotum.  It was explored with a hemostat.  Iodoform dressing was placed.  He was seen in the ED two days and a scrotal ultrasound was performed which noted small complex left hydrocele may be related to hemorrhage or infection. This is associated with hyperemia in the left testicle and epididymis. Imaging features concerning for left epididymo Orchitis.  He was discharged on Bactrim, ibuprofen and percocet.   PMH: History reviewed. No pertinent past medical history.  Surgical History: Past Surgical History:  Procedure Laterality Date  . TOE SURGERY  10 years ago  . WISDOM TOOTH EXTRACTION      Home Medications:  Allergies as of 05/31/2017      Reactions   Penicillins Hives      Medication List        Accurate as of 05/31/17  9:52 AM. Always use your most recent med list.          ibuprofen 600 MG tablet Commonly known as:  ADVIL,MOTRIN Take 1 tablet (600 mg total) by mouth every 6 (six) hours as needed.   oxyCODONE-acetaminophen 5-325 MG tablet Commonly known as:  ROXICET Take 1 tablet by mouth every 4 (four) hours as needed for severe pain.   oxyCODONE-acetaminophen 5-325 MG tablet Commonly known as:  ROXICET Take 1 tablet by mouth every 4 (four) hours as needed for severe pain.   sulfamethoxazole-trimethoprim 800-160 MG tablet Commonly known as:  BACTRIM DS,SEPTRA DS Take 1 tablet by mouth 2 (two) times daily for  7 days.   sulfamethoxazole-trimethoprim 800-160 MG tablet Commonly known as:  BACTRIM DS,SEPTRA DS Take 1 tablet by mouth every 12 (twelve) hours.       Allergies:  Allergies  Allergen Reactions  . Penicillins Hives    Family History: History reviewed. No pertinent family history.  Social History:  reports that he has been smoking cigarettes.  He has been smoking about 0.50 packs per day. he has never used smokeless tobacco. He reports that he drinks alcohol. He reports that he does not use drugs.  ROS:                                        Physical Exam: BP (!) 131/92 (BP Location: Right Arm, Patient Position: Sitting, Cuff Size: Normal)   Pulse (!) 103   Ht 5\' 8"  (1.727 m)   Wt 200 lb (90.7 kg)   BMI 30.41 kg/m   Constitutional:  Alert and oriented, No acute distress. HEENT: [place] AT, moist mucus membranes.  Trachea midline, no masses. Cardiovascular: No clubbing, cyanosis, or edema. Respiratory: Normal respiratory effort, no increased work of breathing. GI: Abdomen is soft, nontender, nondistended, no abdominal masses GU: No CVA tenderness.  Small amount of iodoform packing removed from left 1 cm hemiscrotum incision.  No further drainage.  There is no cavity that  is big enough to suspect any significant remaining iodoform.  There is induration consistent with known hydrocele and reactive response to infection.  I see no drainage.  I see no fluctuance.  There is no crepitus.  It appears that most of the changes are from induration related to infection without any drainable fluid collection. Skin: No rashes, bruises or suspicious lesions. Lymph: No cervical or inguinal adenopathy. Neurologic: Grossly intact, no focal deficits, moving all 4 extremities. Psychiatric: Normal mood and affect.  Laboratory Data: Lab Results  Component Value Date   WBC 18.5 (H) 05/28/2017   HGB 14.5 05/28/2017   HCT 42.8 05/28/2017   MCV 84.3 05/28/2017   PLT 226  05/28/2017    Lab Results  Component Value Date   CREATININE 0.75 05/28/2017    No results found for: PSA  No results found for: TESTOSTERONE  No results found for: HGBA1C  Urinalysis    Component Value Date/Time   COLORURINE YELLOW (A) 05/28/2017 1304   APPEARANCEUR HAZY (A) 05/28/2017 1304   LABSPEC 1.027 05/28/2017 1304   PHURINE 5.0 05/28/2017 1304   GLUCOSEU NEGATIVE 05/28/2017 1304   HGBUR NEGATIVE 05/28/2017 1304   BILIRUBINUR NEGATIVE 05/28/2017 1304   KETONESUR NEGATIVE 05/28/2017 1304   PROTEINUR NEGATIVE 05/28/2017 1304   NITRITE NEGATIVE 05/28/2017 1304   LEUKOCYTESUR NEGATIVE 05/28/2017 1304     Assessment & Plan:    1. Left scrotal abscess s/p vasectomy Packing removed.  I have extended his Bactrim to 14 days.  I did have a long discussion with him about how long he can take for scrotal edema and induration to resolve.  I have recommended continue icing and scrotal elevation.  He was given refill of his pain medications.  Have also instructed him to take Advil on a scheduled basis to help with inflammation  Return for Monday with [facility] for wound check.  Hildred Laser, MD  [facility] [address] [place], [place] [address] [phone]

## 2017-06-01 LAB — WOUND CULTURE

## 2017-06-02 NOTE — Progress Notes (Signed)
06/03/2017 9:03 AM   Colin Moore [birth year] [id number]  Referring provider: No referring provider defined for this encounter.  Chief Complaint  Patient presents with  . Follow-up    HPI: The patient is a 27 year old male who underwent a vasectomy on 05/24/2017 presents today for follow-up after having an I&D of a small scrotal abscess on the left side yesterday.  He is currently on Bactrim and Percocet.  Wound cultures are pending.  He presents today for a wound check.  Of note incision was made from an area that was draining his left hemiscrotum.  It was explored with a hemostat.  Iodoform dressing was placed.  He was seen in the ED two days and a scrotal ultrasound was performed which noted small complex left hydrocele may be related to hemorrhage or infection. This is associated with hyperemia in the left testicle and epididymis. Imaging features concerning for left epididymo Orchitis.  He was discharged on Bactrim, ibuprofen and percocet.  His wound culture has returned positive for Staph aureus which is sensitive to the Bactrim.  The Bactrim has been extended for one more week.    He has not had fevers, chills, nausea or vomiting.  His wife stated that the swelling has reduced.    PMH: History reviewed. No pertinent past medical history.  Surgical History: Past Surgical History:  Procedure Laterality Date  . TOE SURGERY  10 years ago  . WISDOM TOOTH EXTRACTION      Home Medications:  Allergies as of 06/03/2017      Reactions   Penicillins Hives      Medication List        Accurate as of 06/03/17  9:03 AM. Always use your most recent med list.          ibuprofen 600 MG tablet Commonly known as:  ADVIL,MOTRIN Take 1 tablet (600 mg total) by mouth every 6 (six) hours as needed.   oxyCODONE-acetaminophen 5-325 MG tablet Commonly known as:  ROXICET Take 1 tablet by mouth every 4 (four) hours as needed for severe pain.   oxyCODONE-acetaminophen 5-325 MG  tablet Commonly known as:  ROXICET Take 1 tablet by mouth every 4 (four) hours as needed for severe pain.   sulfamethoxazole-trimethoprim 800-160 MG tablet Commonly known as:  BACTRIM DS,SEPTRA DS Take 1 tablet by mouth every 12 (twelve) hours.       Allergies:  Allergies  Allergen Reactions  . Penicillins Hives    Family History: History reviewed. No pertinent family history.  Social History:  reports that he has been smoking cigarettes.  He has been smoking about 0.50 packs per day. he has never used smokeless tobacco. He reports that he drinks alcohol. He reports that he does not use drugs.   Physical Exam: BP 133/80 (BP Location: Right Arm, Patient Position: Sitting, Cuff Size: Normal)   Pulse (!) 102   Ht 5\' 8"  (1.727 m)   Wt 200 lb (90.7 kg)   BMI 30.41 kg/m   Constitutional:  Alert and oriented, No acute distress. HEENT: [place] AT, moist mucus membranes.  Trachea midline, no masses. Cardiovascular: No clubbing, cyanosis, or edema. Respiratory: Normal respiratory effort, no increased work of breathing. GI: Abdomen is soft, nontender, nondistended, no abdominal masses GU: No CVA tenderness.  Small amount of iodoform packing removed from left 1 cm hemiscrotum incision.  No further drainage.  There is no cavity that is big enough to suspect any significant remaining iodoform.  There is induration consistent with known hydrocele  and reactive response to infection.  There is a scant amount of drainage.  I see no fluctuance.  There is no crepitus.  It appears that most of the changes are from induration related to infection without any drain able fluid collection.  The bruising has abated.   Skin: No rashes, bruises or suspicious lesions. Lymph: No cervical or inguinal adenopathy. Neurologic: Grossly intact, no focal deficits, moving all 4 extremities. Psychiatric: Normal mood and affect.  Laboratory Data: Lab Results  Component Value Date   WBC 18.5 (H) 05/28/2017   HGB 14.5  05/28/2017   HCT 42.8 05/28/2017   MCV 84.3 05/28/2017   PLT 226 05/28/2017    Lab Results  Component Value Date   CREATININE 0.75 05/28/2017    Urinalysis    Component Value Date/Time   COLORURINE YELLOW (A) 05/28/2017 1304   APPEARANCEUR HAZY (A) 05/28/2017 1304   LABSPEC 1.027 05/28/2017 1304   PHURINE 5.0 05/28/2017 1304   GLUCOSEU NEGATIVE 05/28/2017 1304   HGBUR NEGATIVE 05/28/2017 1304   BILIRUBINUR NEGATIVE 05/28/2017 1304   KETONESUR NEGATIVE 05/28/2017 1304   PROTEINUR NEGATIVE 05/28/2017 1304   NITRITE NEGATIVE 05/28/2017 1304   LEUKOCYTESUR NEGATIVE 05/28/2017 1304   Results for orders placed or performed in visit on 05/29/17  WOUND CULTURE  Result Value Ref Range   Gram Stain Result Final report    Organism ID, Bacteria Comment    Organism ID, Bacteria Comment    Aerobic Bacterial Culture Final report (A)    Organism ID, Bacteria Staphylococcus aureus (A)    Organism ID, Bacteria Routine flora    Antimicrobial Susceptibility Comment    I have reviewed the labs  Assessment & Plan:    1. Left scrotal abscess s/p vasectomy  - improving  - continue Bactrim and ibuprofen  - Advised to contact our office or seek treatment in the ED if becomes febrile or pain/ vomiting are difficult control in order to arrange for emergent/urgent intervention  - RTC on Friday  Return for Friday .  Michiel CowboySHANNON Wallace Cogliano, PA-C  [facility] [address], [address] [place], [place] [address] [phone]

## 2017-06-03 ENCOUNTER — Ambulatory Visit (INDEPENDENT_AMBULATORY_CARE_PROVIDER_SITE_OTHER): Payer: Self-pay | Admitting: Urology

## 2017-06-03 ENCOUNTER — Encounter: Payer: Self-pay | Admitting: Urology

## 2017-06-03 VITALS — BP 133/80 | HR 102 | Ht 68.0 in | Wt 200.0 lb

## 2017-06-03 DIAGNOSIS — N492 Inflammatory disorders of scrotum: Secondary | ICD-10-CM

## 2017-06-07 ENCOUNTER — Ambulatory Visit (INDEPENDENT_AMBULATORY_CARE_PROVIDER_SITE_OTHER): Payer: Self-pay | Admitting: Urology

## 2017-06-07 ENCOUNTER — Encounter: Payer: Self-pay | Admitting: Urology

## 2017-06-07 VITALS — BP 117/76 | HR 56 | Ht 68.0 in | Wt 203.8 lb

## 2017-06-07 DIAGNOSIS — L089 Local infection of the skin and subcutaneous tissue, unspecified: Secondary | ICD-10-CM

## 2017-06-07 DIAGNOSIS — T148XXA Other injury of unspecified body region, initial encounter: Secondary | ICD-10-CM

## 2017-06-07 NOTE — Progress Notes (Signed)
06/07/2017 9:13 AM   Colin Moore 10-07-1990 [id number]  Referring provider: No referring provider defined for this encounter.  Chief Complaint  Patient presents with  . Follow-up    HPI: The patient is a 27 year old gentleman who underwent a vasectomy on 05/24/2017 who presents today for follow-up after undergoing a I&D of a small superficial abscess on the left side at the skin incision.  Presents today for a wound check.  It was initially packed with iodoform dressing but that was since removed approximately 1 week ago.  He was seen in the ED prior to the I&D showed a small complex left hydrocele.  There is no sign of significant abscess at that time.  He is currently finishing a long course of Bactrim.  Patient reports that his pain is significantly improved since his last seen.  He still has a small amount of drainage.  No fevers or chills.  Feels much better than he did approximately 1 week ago.   PMH: History reviewed. No pertinent past medical history.  Surgical History: Past Surgical History:  Procedure Laterality Date  . TOE SURGERY  10 years ago  . WISDOM TOOTH EXTRACTION      Home Medications:  Allergies as of 06/07/2017      Reactions   Penicillins Hives      Medication List        Accurate as of 06/07/17  9:13 AM. Always use your most recent med list.          ibuprofen 600 MG tablet Commonly known as:  ADVIL,MOTRIN Take 1 tablet (600 mg total) by mouth every 6 (six) hours as needed.   oxyCODONE-acetaminophen 5-325 MG tablet Commonly known as:  ROXICET Take 1 tablet by mouth every 4 (four) hours as needed for severe pain.   sulfamethoxazole-trimethoprim 800-160 MG tablet Commonly known as:  BACTRIM DS,SEPTRA DS Take 1 tablet by mouth every 12 (twelve) hours.       Allergies:  Allergies  Allergen Reactions  . Penicillins Hives    Family History: History reviewed. No pertinent family history.  Social History:  reports that he has  been smoking cigarettes.  He has been smoking about 0.50 packs per day. he has never used smokeless tobacco. He reports that he drinks alcohol. He reports that he does not use drugs.  ROS: UROLOGY Frequent Urination?: Yes Hard to postpone urination?: Yes Burning/pain with urination?: No Get up at night to urinate?: Yes Leakage of urine?: No Urine stream starts and stops?: No Trouble starting stream?: No Do you have to strain to urinate?: No Blood in urine?: No Urinary tract infection?: No Sexually transmitted disease?: No Injury to kidneys or bladder?: No Painful intercourse?: No Weak stream?: No Erection problems?: No Penile pain?: No  Gastrointestinal Nausea?: No Vomiting?: No Indigestion/heartburn?: Yes Diarrhea?: No Constipation?: Yes  Constitutional Fever: No Night sweats?: No Weight loss?: No Fatigue?: No  Skin Skin rash/lesions?: No Itching?: No  Eyes Blurred vision?: No Double vision?: No  Ears/Nose/Throat Sore throat?: No Sinus problems?: No  Hematologic/Lymphatic Swollen glands?: No Easy bruising?: No  Cardiovascular Leg swelling?: No Chest pain?: No  Respiratory Cough?: No Shortness of breath?: No  Endocrine Excessive thirst?: No  Musculoskeletal Back pain?: No Joint pain?: No  Neurological Headaches?: No Dizziness?: No  Psychologic Depression?: No Anxiety?: No  Physical Exam: BP 117/76 (BP Location: Right Arm, Patient Position: Sitting, Cuff Size: Normal)   Pulse (!) 56   Ht 5\' 8"  (1.727 m)   Wt 203  lb 12.8 oz (92.4 kg)   BMI 30.99 kg/m   Constitutional:  Alert and oriented, No acute distress. HEENT: [place] AT, moist mucus membranes.  Trachea midline, no masses. Cardiovascular: No clubbing, cyanosis, or edema. Respiratory: Normal respiratory effort, no increased work of breathing. GI: Abdomen is soft, nontender, nondistended, no abdominal masses GU: No CVA tenderness.  Overall exam improved.  Normal right testicle.  Normal  phallus.  Swelling and edema on the left testicle is dramatically improved.  There is still a small amount of drainage from the left scrotal incision.  No fluctuance.  No fluid collection.  No sign of Fournier's. Skin: No rashes, bruises or suspicious lesions. Lymph: No cervical or inguinal adenopathy. Neurologic: Grossly intact, no focal deficits, moving all 4 extremities. Psychiatric: Normal mood and affect.  Laboratory Data: Lab Results  Component Value Date   WBC 18.5 (H) 05/28/2017   HGB 14.5 05/28/2017   HCT 42.8 05/28/2017   MCV 84.3 05/28/2017   PLT 226 05/28/2017    Lab Results  Component Value Date   CREATININE 0.75 05/28/2017    No results found for: PSA  No results found for: TESTOSTERONE  No results found for: HGBA1C  Urinalysis    Component Value Date/Time   COLORURINE YELLOW (A) 05/28/2017 1304   APPEARANCEUR HAZY (A) 05/28/2017 1304   LABSPEC 1.027 05/28/2017 1304   PHURINE 5.0 05/28/2017 1304   GLUCOSEU NEGATIVE 05/28/2017 1304   HGBUR NEGATIVE 05/28/2017 1304   BILIRUBINUR NEGATIVE 05/28/2017 1304   KETONESUR NEGATIVE 05/28/2017 1304   PROTEINUR NEGATIVE 05/28/2017 1304   NITRITE NEGATIVE 05/28/2017 1304   LEUKOCYTESUR NEGATIVE 05/28/2017 1304    Assessment & Plan:    1.  Left wound infection status post vasectomy The patient is improving significantly at this time.  He will finish his antibiotic course at this time.  We will see him back in 1 week to ensure that his small amount of drainage has resolved by that time.  He will call if he has worsening of symptoms in the interim.  Return in about 1 week (around 06/14/2017).  Hildred LaserBrian James Ishaq Maffei, MD  [facility] [address], [address] [place], [place] [address] [phone]

## 2017-06-14 ENCOUNTER — Ambulatory Visit (INDEPENDENT_AMBULATORY_CARE_PROVIDER_SITE_OTHER): Payer: Self-pay | Admitting: Urology

## 2017-06-14 ENCOUNTER — Encounter: Payer: Self-pay | Admitting: Urology

## 2017-06-14 VITALS — BP 124/75 | HR 76 | Ht 68.0 in | Wt 200.0 lb

## 2017-06-14 DIAGNOSIS — L089 Local infection of the skin and subcutaneous tissue, unspecified: Secondary | ICD-10-CM

## 2017-06-14 DIAGNOSIS — T148XXA Other injury of unspecified body region, initial encounter: Secondary | ICD-10-CM

## 2017-06-14 NOTE — Progress Notes (Signed)
06/14/2017 8:58 AM   Colin Moore 1991-01-31 [id number]  Referring provider: No referring provider defined for this encounter.  Chief Complaint  Patient presents with  . Other    wound check     HPI: The patient is a 27 year old gentleman who underwent a vasectomy on 05/24/2017 who presents today for follow-up after undergoing a I&D of a small superficial abscess on the left side at the skin incision.  Presents today for a wound check.  It was initially packed with iodoform dressing but that was since removed approximately 1 week ago.  He was seen in the ED prior to the I&D showed a small complex left hydrocele.  There is no sign of significant abscess at that time.  He has finished a course of Bactrim.  Patient reports that his pain and drainage have resolved.  He still has residual swelling.  He feels he is improving.  No fevers or chills.  No other complications.      PMH: No past medical history on file.  Surgical History: Past Surgical History:  Procedure Laterality Date  . TOE SURGERY  10 years ago  . WISDOM TOOTH EXTRACTION      Home Medications:  Allergies as of 06/14/2017      Reactions   Penicillins Hives      Medication List        Accurate as of 06/14/17  8:58 AM. Always use your most recent med list.          ibuprofen 600 MG tablet Commonly known as:  ADVIL,MOTRIN Take 1 tablet (600 mg total) by mouth every 6 (six) hours as needed.   oxyCODONE-acetaminophen 5-325 MG tablet Commonly known as:  ROXICET Take 1 tablet by mouth every 4 (four) hours as needed for severe pain.   sulfamethoxazole-trimethoprim 800-160 MG tablet Commonly known as:  BACTRIM DS,SEPTRA DS Take 1 tablet by mouth every 12 (twelve) hours.       Allergies:  Allergies  Allergen Reactions  . Penicillins Hives    Family History: No family history on file.  Social History:  reports that he has been smoking cigarettes.  He has been smoking about 0.50 packs per day.  he has never used smokeless tobacco. He reports that he drinks alcohol. He reports that he does not use drugs.  ROS: UROLOGY Frequent Urination?: No Hard to postpone urination?: No Burning/pain with urination?: No Get up at night to urinate?: No Leakage of urine?: No Urine stream starts and stops?: No Trouble starting stream?: No Do you have to strain to urinate?: No Blood in urine?: No Urinary tract infection?: No Sexually transmitted disease?: No Injury to kidneys or bladder?: No Painful intercourse?: No Weak stream?: No Erection problems?: No Penile pain?: No  Gastrointestinal Nausea?: No Vomiting?: No Indigestion/heartburn?: No Diarrhea?: No Constipation?: No  Constitutional Fever: No Night sweats?: No Weight loss?: No Fatigue?: No  Skin Skin rash/lesions?: No Itching?: No  Eyes Blurred vision?: No Double vision?: No  Ears/Nose/Throat Sore throat?: No Sinus problems?: No  Hematologic/Lymphatic Swollen glands?: No Easy bruising?: No  Cardiovascular Leg swelling?: No Chest pain?: No  Respiratory Cough?: No Shortness of breath?: No  Endocrine Excessive thirst?: No  Musculoskeletal Back pain?: No Joint pain?: No  Neurological Headaches?: No Dizziness?: No  Psychologic Depression?: No Anxiety?: No  Physical Exam: BP 124/75   Pulse 76   Ht 5\' 8"  (1.727 m)   Wt 200 lb (90.7 kg)   BMI 30.41 kg/m   Constitutional:  Alert  and oriented, No acute distress. HEENT: [place] AT, moist mucus membranes.  Trachea midline, no masses. Cardiovascular: No clubbing, cyanosis, or edema. Respiratory: Normal respiratory effort, no increased work of breathing. GI: Abdomen is soft, nontender, nondistended, no abdominal masses GU: No CVA tenderness.  Normal phallus.  Normal right testicle.  3 left reactive hydrocele is dramatically decreased in size.  No further drainage.  No fluctuance.  No crepitus.  No sign of abscess.  Overall clinically improving since last  week per Skin: No rashes, bruises or suspicious lesions. Lymph: No cervical or inguinal adenopathy. Neurologic: Grossly intact, no focal deficits, moving all 4 extremities. Psychiatric: Normal mood and affect.  Laboratory Data: Lab Results  Component Value Date   WBC 18.5 (H) 05/28/2017   HGB 14.5 05/28/2017   HCT 42.8 05/28/2017   MCV 84.3 05/28/2017   PLT 226 05/28/2017    Lab Results  Component Value Date   CREATININE 0.75 05/28/2017    No results found for: PSA  No results found for: TESTOSTERONE  No results found for: HGBA1C  Urinalysis    Component Value Date/Time   COLORURINE YELLOW (A) 05/28/2017 1304   APPEARANCEUR HAZY (A) 05/28/2017 1304   LABSPEC 1.027 05/28/2017 1304   PHURINE 5.0 05/28/2017 1304   GLUCOSEU NEGATIVE 05/28/2017 1304   HGBUR NEGATIVE 05/28/2017 1304   BILIRUBINUR NEGATIVE 05/28/2017 1304   KETONESUR NEGATIVE 05/28/2017 1304   PROTEINUR NEGATIVE 05/28/2017 1304   NITRITE NEGATIVE 05/28/2017 1304   LEUKOCYTESUR NEGATIVE 05/28/2017 1304     Assessment & Plan:   1.  Left wound infection status post vasectomy This is quickly resolving.  I did tell the patient that it may take up to a month for his edema to fully improve.  He does not need further follow-up for this infection unless it worsens.  He will drop his semen analysis off x2 as previously planned.  Hildred LaserBrian James Niklaus Mamaril, MD  [facility] [address], [address] [place], [place] [address] [phone]

## 2019-05-06 IMAGING — US US SCROTUM W/ DOPPLER COMPLETE
1 series · 13 of 25 positions shown · non-contrast
Comparison: No comparison studies available.

CLINICAL DATA: Testicular pain with vasectomy 4 days ago

EXAM:
SCROTAL ULTRASOUND
DOPPLER ULTRASOUND OF THE TESTICLES
TECHNIQUE: Complete ultrasound examination of the testicles, epididymis, and
other scrotal structures was performed. Color and spectral Doppler
ultrasound were also utilized to evaluate blood flow to the
testicles.

[Series 1: us scrotum w/ doppler complete · 0.08mm/px · 13 of 49 slices shown]
[im 1/49]
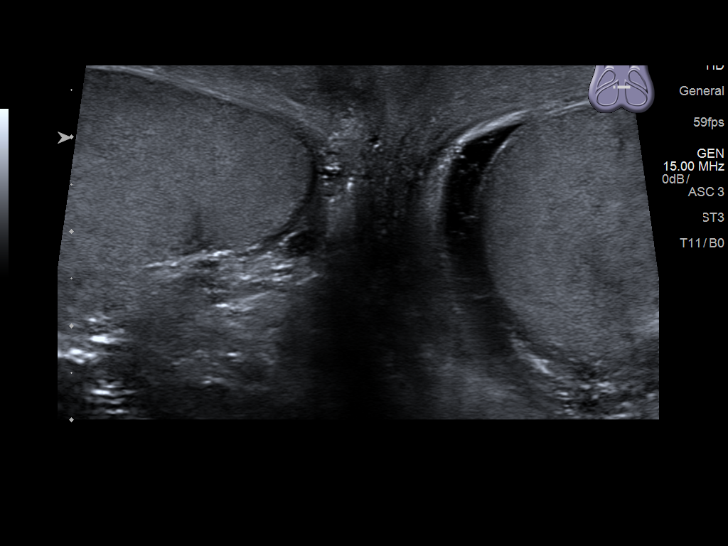
[im 5/49]
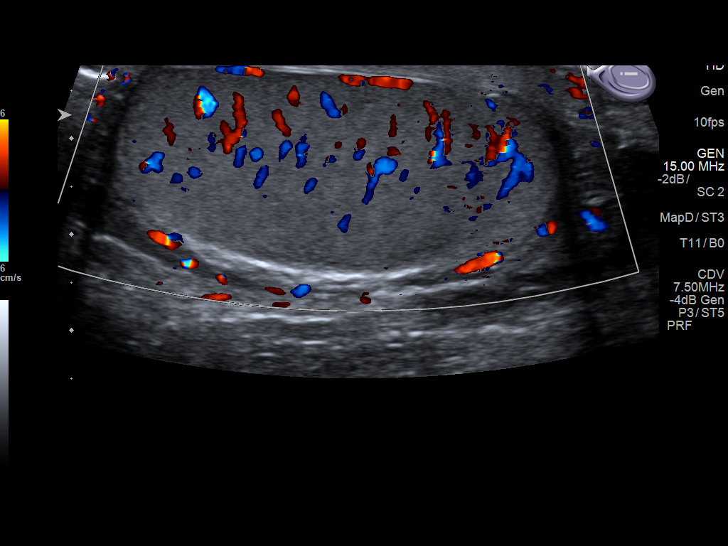
[im 9/49]
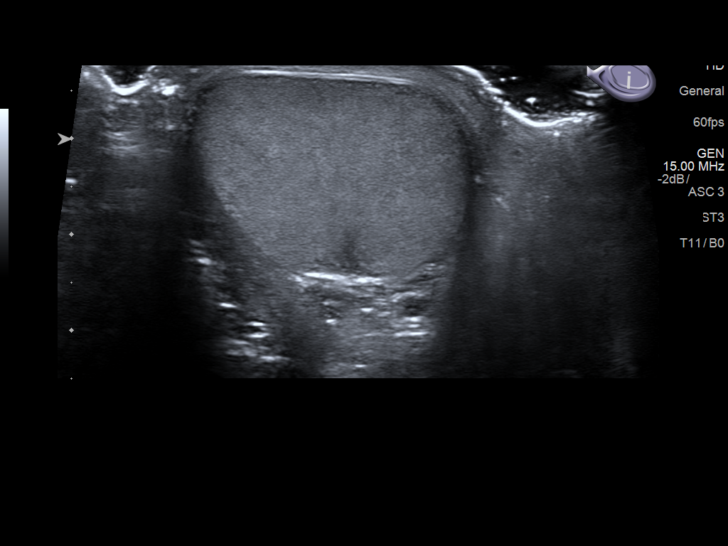
[im 13/49]
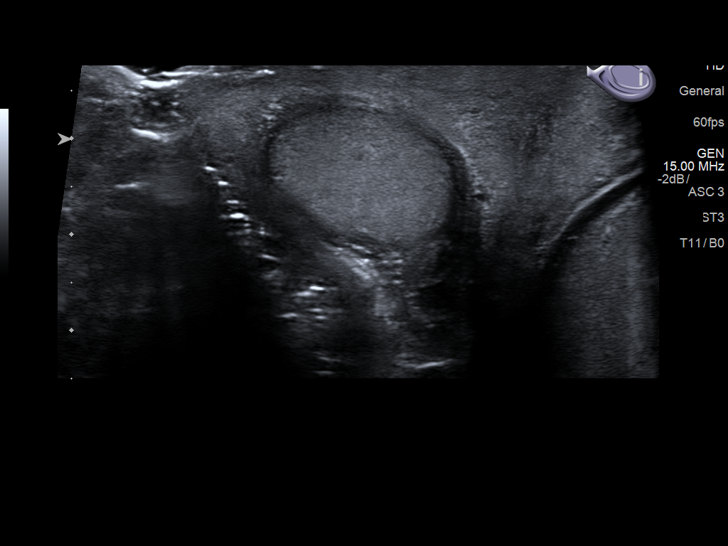
[im 17/49]
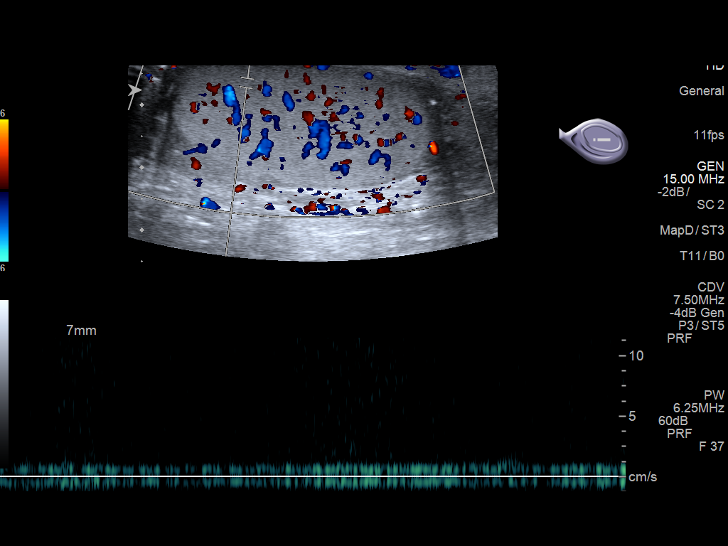
[im 21/49]
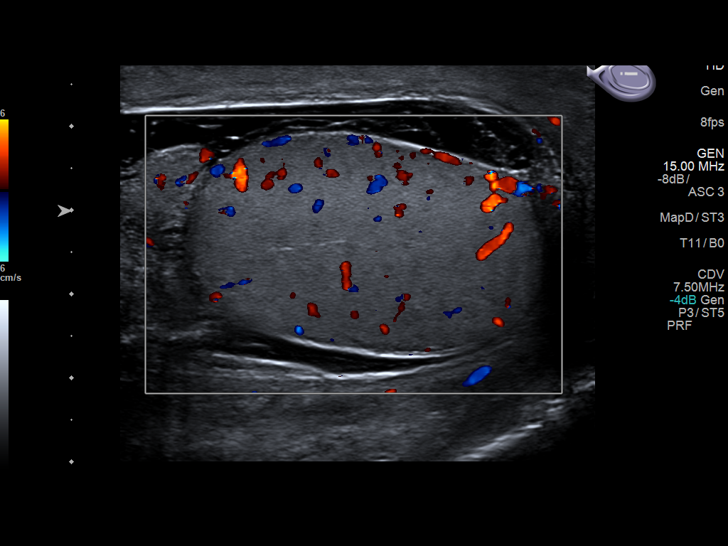
[im 25/49]
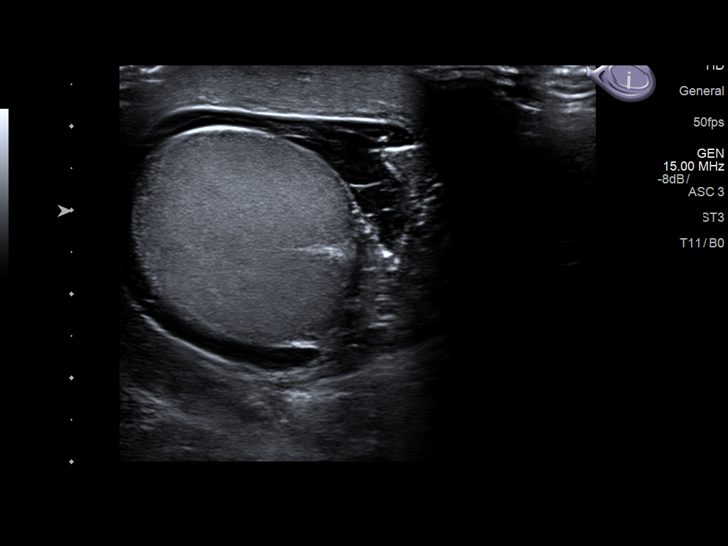
[im 29/49]
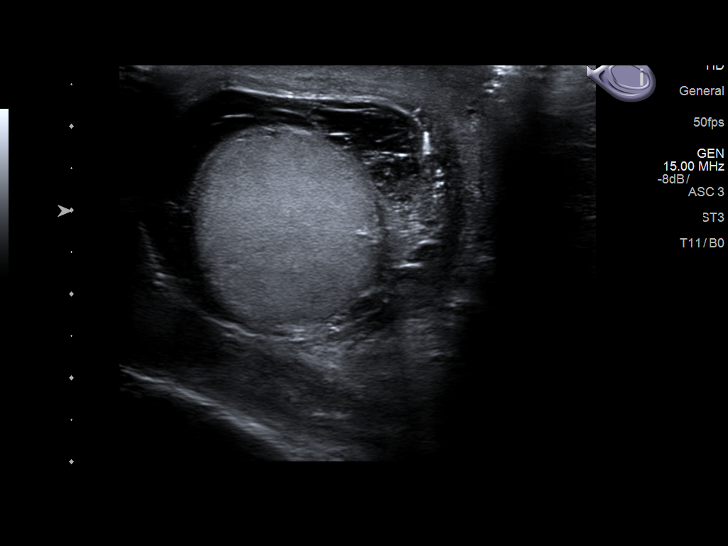
[im 33/49]
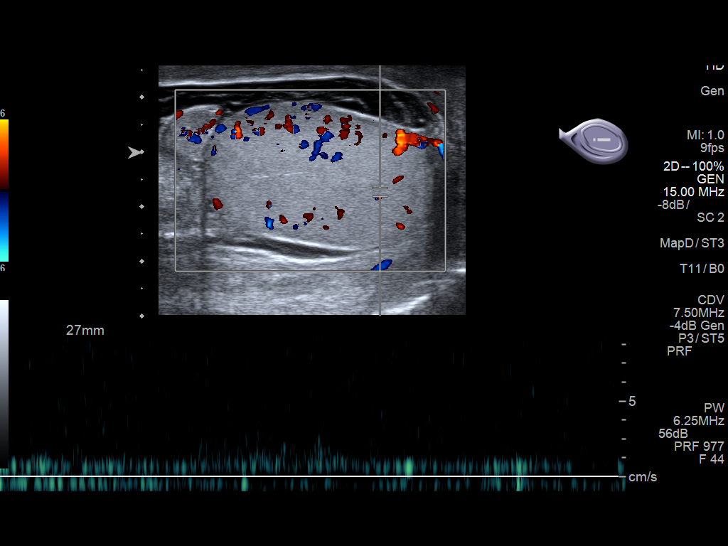
[im 37/49]
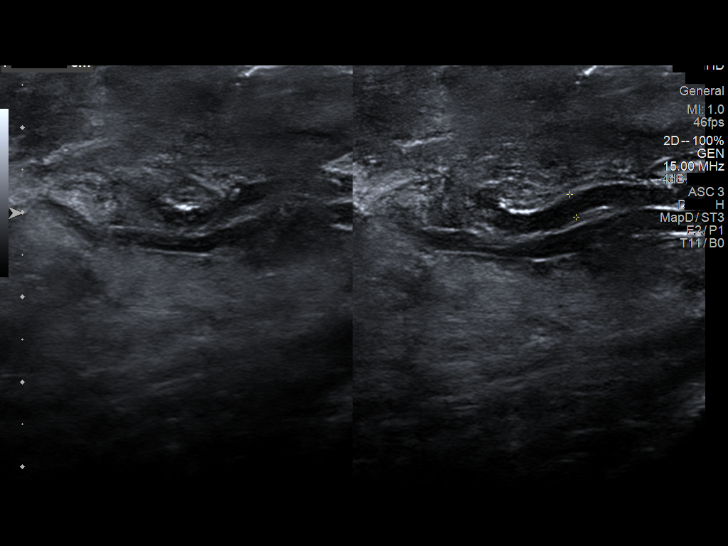
[im 41/49]
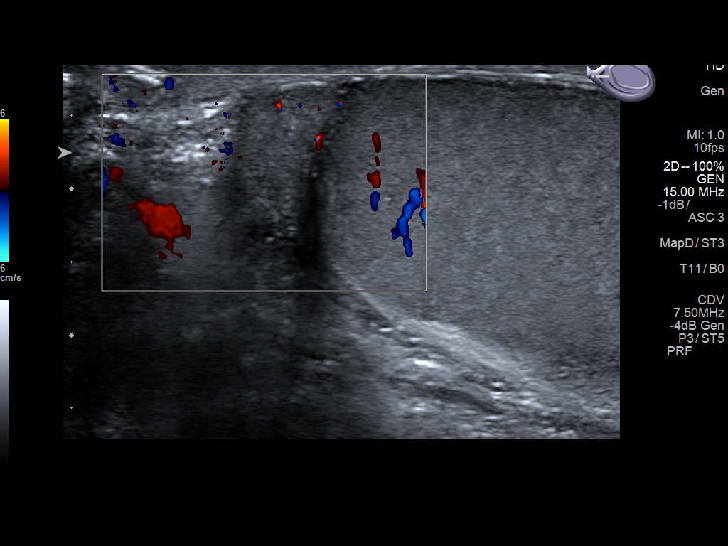
[im 45/49]
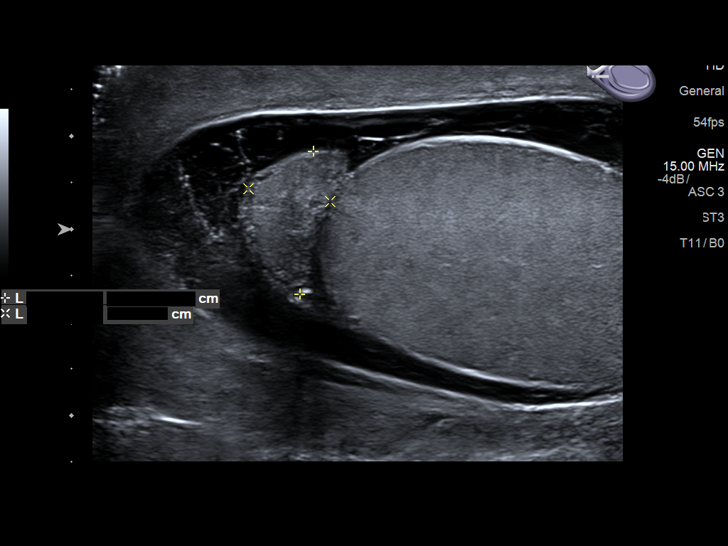
[im 49/49]
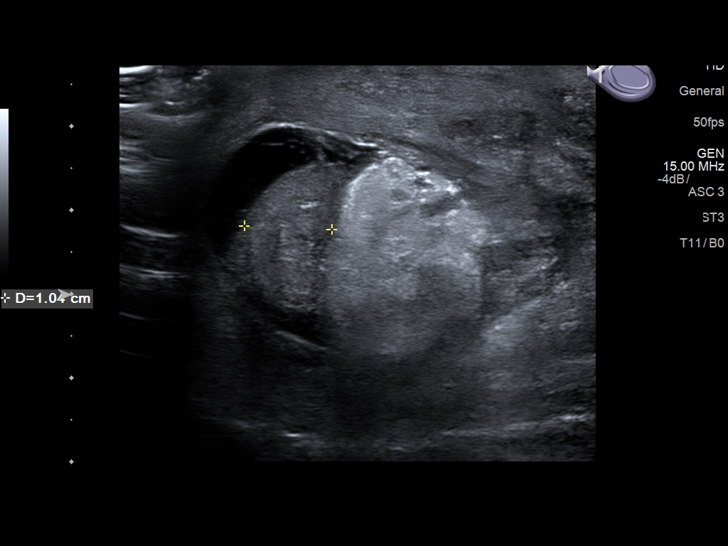

[13 of 25 positions shown; findings below may reference images not displayed]

FINDINGS: Right testicle

Measurements: 4.7 x 2.1 x 2.9 cm. No mass or microlithiasis
visualized.

Left testicle

Measurements: 4.4 x 2.6 x 2.8 cm. No mass or microlithiasis
visualized.

Right epididymis:  Normal in size and appearance.

Left epididymis:  Normal in size and appearance.

Hydrocele: Simple appearing physiologic volume fluid in the right
hemiscrotum. Complex mild left hydrocele demonstrates internal
reticulation.

Varicocele:  None visualized.

Pulsed Doppler interrogation of both testes demonstrates normal low
resistance arterial and venous waveforms bilaterally. Color Doppler
evaluation demonstrates mild hyperemia in the left epididymal
tissues and testicle.
IMPRESSION: 1. Small complex left hydrocele may be related to hemorrhage or
infection. This is associated with hyperemia in the left testicle
and epididymis. Imaging features concerning for left epididymo
orchitis.

## 2021-11-13 ENCOUNTER — Encounter: Payer: Self-pay | Admitting: Internal Medicine

## 2021-11-13 ENCOUNTER — Ambulatory Visit (INDEPENDENT_AMBULATORY_CARE_PROVIDER_SITE_OTHER): Payer: BC Managed Care – PPO | Admitting: Internal Medicine

## 2021-11-13 VITALS — BP 131/77 | HR 84 | Ht 68.0 in | Wt 196.7 lb

## 2021-11-13 DIAGNOSIS — F419 Anxiety disorder, unspecified: Secondary | ICD-10-CM

## 2021-11-13 MED ORDER — CITALOPRAM HYDROBROMIDE 10 MG PO TABS: 10.0000 mg | ORAL_TABLET | Freq: Every day | ORAL | 2 refills | Status: AC
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
## 2021-11-13 NOTE — Assessment & Plan Note (Signed)
Patient was started on citalopram We will get CBC metabolic panel TSH Refer to the psychiatrist if citalopram did not work Patient was advised to read a book on anxiety

## 2021-11-14 LAB — CBC WITH DIFFERENTIAL/PLATELET
Absolute Monocytes: 719 cells/uL (ref 200–950)
Basophils Absolute: 128 cells/uL (ref 0–200)
Basophils Relative: 1.1 %
Eosinophils Absolute: 464 cells/uL (ref 15–500)
Eosinophils Relative: 4 %
HCT: 47.3 % (ref 38.5–50.0)
Hemoglobin: 15.8 g/dL (ref 13.2–17.1)
Lymphs Abs: 3260 cells/uL (ref 850–3900)
MCH: 29.5 pg (ref 27.0–33.0)
MCHC: 33.4 g/dL (ref 32.0–36.0)
MCV: 88.4 fL (ref 80.0–100.0)
MPV: 11.7 fL (ref 7.5–12.5)
Monocytes Relative: 6.2 %
Neutro Abs: 7030 cells/uL (ref 1500–7800)
Neutrophils Relative %: 60.6 %
Platelets: 202 10*3/uL (ref 140–400)
RBC: 5.35 10*6/uL (ref 4.20–5.80)
RDW: 11.6 % (ref 11.0–15.0)
Total Lymphocyte: 28.1 %
WBC: 11.6 10*3/uL — ABNORMAL HIGH (ref 3.8–10.8)

## 2021-11-14 LAB — COMPLETE METABOLIC PANEL WITH GFR
AG Ratio: 2 (calc) (ref 1.0–2.5)
ALT: 24 U/L (ref 9–46)
AST: 20 U/L (ref 10–40)
Albumin: 4.7 g/dL (ref 3.6–5.1)
Alkaline phosphatase (APISO): 70 U/L (ref 36–130)
BUN: 11 mg/dL (ref 7–25)
CO2: 22 mmol/L (ref 20–32)
Calcium: 9.6 mg/dL (ref 8.6–10.3)
Chloride: 106 mmol/L (ref 98–110)
Creat: 1.09 mg/dL (ref 0.60–1.26)
Globulin: 2.4 g/dL (calc) (ref 1.9–3.7)
Glucose, Bld: 69 mg/dL (ref 65–99)
Potassium: 4.5 mmol/L (ref 3.5–5.3)
Sodium: 140 mmol/L (ref 135–146)
Total Bilirubin: 0.4 mg/dL (ref 0.2–1.2)
Total Protein: 7.1 g/dL (ref 6.1–8.1)
eGFR: 92 mL/min/{1.73_m2} (ref >=60)

## 2021-11-14 LAB — TSH: TSH: 1.24 mIU/L (ref 0.40–4.50)

## 2021-12-12 ENCOUNTER — Ambulatory Visit (INDEPENDENT_AMBULATORY_CARE_PROVIDER_SITE_OTHER): Payer: BC Managed Care – PPO | Admitting: Internal Medicine

## 2021-12-12 ENCOUNTER — Other Ambulatory Visit: Payer: Self-pay

## 2021-12-12 ENCOUNTER — Encounter: Payer: Self-pay | Admitting: Internal Medicine

## 2021-12-12 VITALS — BP 135/83 | HR 81 | Ht 68.0 in | Wt 195.9 lb

## 2021-12-12 DIAGNOSIS — E8881 Metabolic syndrome: Secondary | ICD-10-CM | POA: Diagnosis not present

## 2021-12-12 DIAGNOSIS — F419 Anxiety disorder, unspecified: Secondary | ICD-10-CM | POA: Diagnosis not present

## 2021-12-12 DIAGNOSIS — F17200 Nicotine dependence, unspecified, uncomplicated: Secondary | ICD-10-CM | POA: Diagnosis not present

## 2021-12-12 NOTE — Progress Notes (Signed)
Established Patient Office Visit  Subjective:  Patient ID: Colin Moore, male    DOB: 1990-10-28  Age: 31 y.o. MRN: [id number]  CC:  Chief Complaint  Patient presents with   Follow-up    HPI  Colin Moore presents for general checkup.  History reviewed. No pertinent past medical history.  Past Surgical History:  Procedure Laterality Date   TOE SURGERY  10 years ago   WISDOM TOOTH EXTRACTION      History reviewed. No pertinent family history.  Social History   Socioeconomic History   Marital status: Married    Spouse name: Not on file   Number of children: Not on file   Years of education: Not on file   Highest education level: Not on file  Occupational History   Not on file  Tobacco Use   Smoking status: Every Day    Packs/day: 0.50    Types: Cigarettes   Smokeless tobacco: Never  Substance and Sexual Activity   Alcohol use: Yes    Comment: Socially   Drug use: No   Sexual activity: Not on file  Other Topics Concern   Not on file  Social History Narrative   Not on file   Social Determinants of Health   Financial Resource Strain: Not on file  Food Insecurity: Not on file  Transportation Needs: Not on file  Physical Activity: Not on file  Stress: Not on file  Social Connections: Not on file  Intimate Partner Violence: Not on file     Current Outpatient Medications:    citalopram (CELEXA) 10 MG tablet, Take 1 tablet (10 mg total) by mouth daily., Disp: 30 tablet, Rfl: 2   ibuprofen (ADVIL,MOTRIN) 600 MG tablet, Take 1 tablet (600 mg total) by mouth every 6 (six) hours as needed., Disp: 20 tablet, Rfl: 0   Allergies  Allergen Reactions   Penicillins Hives    ROS Review of Systems  Constitutional: Negative.   HENT: Negative.    Eyes: Negative.   Respiratory: Negative.    Cardiovascular: Negative.   Gastrointestinal: Negative.   Endocrine: Negative.   Genitourinary: Negative.   Musculoskeletal: Negative.   Skin: Negative.    Allergic/Immunologic: Negative.   Neurological: Negative.   Hematological: Negative.   Psychiatric/Behavioral: Negative.    All other systems reviewed and are negative.     Objective:    Physical Exam Vitals reviewed.  Constitutional:      Appearance: Normal appearance.  HENT:     Mouth/Throat:     Mouth: Mucous membranes are moist.  Eyes:     Pupils: Pupils are equal, round, and reactive to light.  Neck:     Vascular: No carotid bruit.  Cardiovascular:     Rate and Rhythm: Normal rate and regular rhythm.     Pulses: Normal pulses.     Heart sounds: Normal heart sounds.  Pulmonary:     Effort: Pulmonary effort is normal.     Breath sounds: Normal breath sounds.  Abdominal:     General: Bowel sounds are normal.     Palpations: Abdomen is soft. There is no hepatomegaly, splenomegaly or mass.     Tenderness: There is no abdominal tenderness.     Hernia: No hernia is present.  Musculoskeletal:     Cervical back: Neck supple.     Right lower leg: No edema.     Left lower leg: No edema.  Skin:    Findings: No rash.  Neurological:     Mental Status:  He is alert and oriented to person, place, and time.     Motor: No weakness.  Psychiatric:        Mood and Affect: Mood normal.        Behavior: Behavior normal.     BP 135/83   Pulse 81   Ht '5\' 8"'  (1.727 m)   Wt 195 lb 14.4 oz (88.9 kg)   BMI 29.79 kg/m  Wt Readings from Last 3 Encounters:  12/12/21 195 lb 14.4 oz (88.9 kg)  11/13/21 196 lb 11.2 oz (89.2 kg)  06/14/17 200 lb (90.7 kg)     Health Maintenance Due  Topic Date Due   COVID-19 Vaccine (1) Never done   HIV Screening  Never done   Hepatitis C Screening  Never done   TETANUS/TDAP  11/20/2017    There are no preventive care reminders to display for this patient.  Lab Results  Component Value Date   TSH 1.24 11/13/2021   Lab Results  Component Value Date   WBC 11.6 (H) 11/13/2021   HGB 15.8 11/13/2021   HCT 47.3 11/13/2021   MCV 88.4  11/13/2021   PLT 202 11/13/2021   Lab Results  Component Value Date   NA 140 11/13/2021   K 4.5 11/13/2021   CO2 22 11/13/2021   GLUCOSE 69 11/13/2021   BUN 11 11/13/2021   CREATININE 1.09 11/13/2021   BILITOT 0.4 11/13/2021   ALKPHOS 69 06/21/2016   AST 20 11/13/2021   ALT 24 11/13/2021   PROT 7.1 11/13/2021   ALBUMIN 4.3 06/21/2016   CALCIUM 9.6 11/13/2021   ANIONGAP 10 05/28/2017   EGFR 92 11/13/2021   No results found for: "CHOL" No results found for: "HDL" No results found for: "LDLCALC" No results found for: "TRIG" No results found for: "CHOLHDL" No results found for: "HGBA1C"    Assessment & Plan:   Problem List Items Addressed This Visit   None   No orders of the defined types were placed in this encounter.   Follow-up: No follow-ups on file.    Cletis Athens, MD

## 2021-12-12 NOTE — Assessment & Plan Note (Signed)
-   I instructed the patient to stop smoking and provided them with smoking cessation materials.  - I informed the patient that smoking puts them at increased risk for cancer, COPD, hypertension, and more.  - Informed the patient to seek help if they begin to have trouble breathing, develop chest pain, start to cough up blood, feel faint, or pass out.  

## 2021-12-12 NOTE — Assessment & Plan Note (Signed)
Patient was advised to keep wt under control

## 2021-12-12 NOTE — Assessment & Plan Note (Signed)
-   Patient experiencing high levels of anxiety.  - Encouraged patient to engage in relaxing activities like yoga, meditation, journaling, going for a walk, or participating in a hobby.  - Encouraged patient to reach out to trusted friends or family members about recent struggles, Patient was advised to read A book, how to stop worrying and start living, it is good book to read to control  the stress  

## 2022-01-22 ENCOUNTER — Ambulatory Visit: Payer: BC Managed Care – PPO | Admitting: Internal Medicine
